# Patient Record
Sex: Female | Born: 1945 | Race: White | Hispanic: No | Smoking: Former smoker
Health system: Southern US, Community
[De-identification: ages and names within clinical notes are randomized; demographics above are authoritative.]

## PROBLEM LIST (undated history)

## (undated) DIAGNOSIS — F329 Major depressive disorder, single episode, unspecified: Secondary | ICD-10-CM

## (undated) DIAGNOSIS — E669 Obesity, unspecified: Secondary | ICD-10-CM

## (undated) DIAGNOSIS — R7303 Prediabetes: Secondary | ICD-10-CM

## (undated) DIAGNOSIS — E785 Hyperlipidemia, unspecified: Secondary | ICD-10-CM

## (undated) DIAGNOSIS — E559 Vitamin D deficiency, unspecified: Secondary | ICD-10-CM

## (undated) DIAGNOSIS — K219 Gastro-esophageal reflux disease without esophagitis: Secondary | ICD-10-CM

## (undated) DIAGNOSIS — F32A Depression, unspecified: Secondary | ICD-10-CM

## (undated) DIAGNOSIS — I10 Essential (primary) hypertension: Secondary | ICD-10-CM

## (undated) HISTORY — PX: ABDOMINAL HYSTERECTOMY: SHX81

## (undated) HISTORY — DX: Depression, unspecified: F32.A

## (undated) HISTORY — DX: Essential (primary) hypertension: I10

## (undated) HISTORY — DX: Prediabetes: R73.03

## (undated) HISTORY — DX: Obesity, unspecified: E66.9

## (undated) HISTORY — PX: CARPAL TUNNEL RELEASE: SHX101

## (undated) HISTORY — PX: LASIK: SHX215

## (undated) HISTORY — DX: Hyperlipidemia, unspecified: E78.5

## (undated) HISTORY — PX: SKIN CANCER EXCISION: SHX779

## (undated) HISTORY — PX: OTHER SURGICAL HISTORY: SHX169

## (undated) HISTORY — DX: Major depressive disorder, single episode, unspecified: F32.9

## (undated) HISTORY — DX: Vitamin D deficiency, unspecified: E55.9

## (undated) HISTORY — DX: Gastro-esophageal reflux disease without esophagitis: K21.9

## (undated) NOTE — Telephone Encounter (Signed)
 Formatting of this note might be different from the original. Spoke to pt who is overdue for an apt.  She would prefer to be seen in the [facility].  She is a former pt of Dr Malachy.  Apt scheduled with MD as ACP available are all EP providers.  Electronically signed by Devere Jenkins Starling, RN at 04/13/2022 10:45 AM EST

## (undated) NOTE — Telephone Encounter (Signed)
 Formatting of this note might be different from the original.  Medication Refill Request  Action taken:   deferred for nurse review, medication has been discontinued from chart.     Electronically signed by Marieta Kerin Oz, MA at 04/11/2022 10:06 AM EST

---

## 1976-04-09 HISTORY — PX: PILONIDAL CYST EXCISION: SHX744

## 2003-08-10 ENCOUNTER — Inpatient Hospital Stay (HOSPITAL_COMMUNITY): Admission: EM | Admit: 2003-08-10 | Discharge: 2003-08-12 | Payer: Self-pay

## 2003-08-10 ENCOUNTER — Encounter (INDEPENDENT_AMBULATORY_CARE_PROVIDER_SITE_OTHER): Payer: Self-pay | Admitting: Cardiology

## 2004-04-25 ENCOUNTER — Observation Stay (HOSPITAL_COMMUNITY): Admission: EM | Admit: 2004-04-25 | Discharge: 2004-04-27 | Payer: Self-pay

## 2005-02-12 ENCOUNTER — Inpatient Hospital Stay (HOSPITAL_COMMUNITY): Admission: EM | Admit: 2005-02-12 | Discharge: 2005-02-16 | Payer: Self-pay | Admitting: Emergency Medicine

## 2007-08-04 ENCOUNTER — Encounter: Admission: RE | Admit: 2007-08-04 | Discharge: 2007-08-04 | Payer: Self-pay | Admitting: Internal Medicine

## 2008-02-02 ENCOUNTER — Encounter: Admission: RE | Admit: 2008-02-02 | Discharge: 2008-02-02 | Payer: Self-pay | Admitting: Internal Medicine

## 2008-03-27 LAB — HM MAMMOGRAPHY: HM Mammogram: NEGATIVE

## 2010-08-25 NOTE — Procedures (Signed)
CLINICAL HISTORY:  The patient is a 65 year old with possible cerebral  vascular accident. Study is being done to look for evidence of conduction  abnormalities in the brain-stem auditory pathways.   PROCEDURE:  The study was carried out using a 100-microsecond square-wave  click generated at 85 decibels. Frequency was 11.1 seconds. A 10-millisecond  epoch was described. Two thousand stimuli were averaged to provide the final  response. All latencies and inter peak latencies were expressed in  milliseconds.   FINDINGS:  Stimulation of the left ear produced well-defined waveforms with  excellent interim correlation. Wave 1:  1.54, wave 3:  3.80, wave 5:  5.87.  Similarly, stimulation of the right ear produced well defined wave forms  with good inter run correlation. Latencies were as follows:  Wave 1:  1.70,  wave 3:  3.88, wave 5:  5.99.   Inter peak latencies on the right side between wave 1 and 3:  2.26, between  waves 3 and 5 2.07, between waves 1 and 5:  4.33. On the right between waves  1 and 3:  2.18, between waves 3 and 5:  2.11, between waves 1 and 5:  4.29.   IMPRESSION:  These brain-stem auditory evoked responses are within normal  limits and show no evidence of conduction abnormality in the brainstem  auditory pathways bilaterally.    WILLIAM H. Sharene Skeans, M.D.   ZOX:WRUE  D:  08/12/2003 00:20:21  T:  08/12/2003 07:51:51  Job #:  [id number]   cc:   Marlan Palau, M.D.  [address]  [address]  [place]  [place] [address]  Fax: [phone]

## 2010-08-25 NOTE — H&P (Signed)
[name], [name]                ACCOUNT NO.:  [id number]   MEDICAL RECORD NO.:  [id number]          PATIENT TYPE:  EMS   LOCATION:  ED                           FACILITY:  [facility]   PHYSICIAN:  Lonia Blood, M.D.       DATE OF BIRTH:  1946-02-13   DATE OF ADMISSION:  02/12/2005  DATE OF DISCHARGE:                                HISTORY & PHYSICAL   PRIMARY CARE PHYSICIAN:  Lucky Cowboy, M.D.   CHIEF COMPLAINT:  Back pain.   HISTORY OF PRESENT ILLNESS:  [name] is a 65 year old woman with a  history of hypertension who comes into [facility] emergency room after she  had four days of suprapubic tenderness, passing cloudy urine and hematuria.  She reports on the day of admission, her symptoms got significantly more  severe with the pain radiating to the back.  She has seen her doctor, who  prescribed ciprofloxacin for her.  The patient was unable to take the  medication due to nausea, and she was unable to take the antibiotic, so she  came to the emergency room to be evaluated.   PAST MEDICAL HISTORY:  1.  Transient ischemic attack.  2.  Gastroesophageal reflux disease.  3.  Hypertension.  4.  Basal cell carcinoma of the skin.   FAMILY HISTORY:  She has two children who are healthy.  Eight brothers, six  of them living.  Parents are both deceased.  No history of cancer in the  family.   SOCIAL HISTORY:  Patient does not drink alcohol.  She does not smoke  tobacco.  She is widowed.  She has two children.  She takes care of her  grandchild.  She is currently retired.   DRUG ALLERGIES:  PENICILLIN.   MEDICATIONS:  Potassium chloride, chloride, Zetia, Prozac, Xanax p.r.n.,  aspirin, hydrochlorothiazide, triamterene, Prilosec, and Foltx.   REVIEW OF SYSTEMS:  Negative for chest pain.  Negative for dyspnea.  Positive for some calf pain.  Negative for leg swelling and positive for  nausea.  All other systems are negative.   PHYSICAL EXAMINATION:  VITAL SIGNS:  Temperature  99.2, pulse 74,  respirations 22, blood pressure 135/81.  GENERAL APPEARANCE:  She is well-developed and well-nourished in no acute  distress.  Alert and oriented to person, place, and time.  HEENT:  Head is normocephalic and atraumatic.  Eyes:  Pupils are equal,  round and reactive to light and accommodation.  Extraocular movements are  intact.  Conjunctivae anicteric.  Throat is clear.  NECK:  Without JVD.  No carotid bruits.  No thyromegaly.  LUNGS:  Clear to auscultation without wheezes, rhonchi, or crackles.  HEART:  S1 and S2.  Regular rate and rhythm without murmurs, rubs or  gallops.  ABDOMEN:  Soft, nontender, nondistended.  Bowel sounds are present.  EXTREMITIES:  Without edema.  There is positive CVA tenderness bilaterally.  There is also positive tenderness over the lumbosacral spine in the midline.  There is positive sciatic points bilaterally.  The straight leg raise test  is negative.  SKIN:  Patient has some  small basal cell cancers on the dorsum of the hands.   LABORATORY VALUES:  White blood cell count 19,000, hemoglobin 14, platelet  count 189.  Sodium 132, potassium 3.8, glucose 155, BUN 9, creatinine 1.1,  chloride 98, bicarb 27.  Urinalysis shows 21-50 white blood cells.  The  blood smear shows vacuolated neutrophils with toxic granulations.   IMPRESSION:  1.  Pyelonephritis with significant systemic symptoms:  Patient unable to      keep the oral medication down.  The plan is to admit patient to the      hospital, obtain a urinary culture, start her on intravenous and      ciprofloxacin.  If her symptoms will not improve in the next couple of      days, we will proceed with imaging studies for her kidneys.  2.  Back pain, acute-on-chronic:  Will image the lumbosacral spine, as we      suspect this patient may have degenerative disk disease of the spine.      We will treat her empirically with Tylenol p.r.n. as well as oxycodone      as needed for more severe  pain.  3.  Esophageal reflux disease:  We will give this patient Protonix while in      the hospital.      Lonia Blood, M.D.  Electronically Signed     SL/MEDQ  D:  02/12/2005  T:  02/12/2005  Job:  [id number]   cc:   Lucky Cowboy, M.D.  Fax: [phone]

## 2010-08-25 NOTE — H&P (Signed)
[name], [name]                          ACCOUNT NO.:  [id number]   MEDICAL RECORD NO.:  [id number]                   PATIENT TYPE:  INP   LOCATION:  3727                                 FACILITY:  [facility]   PHYSICIAN:  Renato Battles, M.D.                  DATE OF BIRTH:  February 20, 1946   DATE OF ADMISSION:  08/09/2003  DATE OF DISCHARGE:                                HISTORY & PHYSICAL   REASON FOR ADMISSION:  Dizziness.   HISTORY OF PRESENT ILLNESS:  The patient is a very pleasant 65 year old  white female with vertigo, blurry vision, and presyncope symptoms that  started suddenly today and lasted for a few minutes and then resolved  spontaneously.  The patient reported having similar symptoms or episodes  periodically, the last episode was about one week ago.  Each episode was  associated with heaviness and numbness of the upper extremities bilaterally.  No loss of consciousness.  No other neurologic findings.   REVIEW OF SYSTEMS:  CONSTITUTIONAL:  No fevers, chills, or night sweats.  No  weight changes.  GASTROINTESTINAL:  No nausea, vomiting, diarrhea,  constipation.  CARDIOPULMONARY:  Positive for shortness of breath, but no  chest pain, no cough, no orthopnea or PND.  GENITOURINARY:  No dysuria,  hematuria, or retention.   PAST MEDICAL HISTORY:  1. Depression.  2. Pulmonary hypertension.  3. Hypercholesterolemia.   PAST SURGICAL HISTORY:  1. Bilateral LASIK surgery on her eyes.  2. Bilateral carpal tunnel surgery.  3. Vaginal hysterectomy.   FAMILY HISTORY:  Positive for CAD, stroke, and Alzheimer's.   SOCIAL HISTORY:  She quit smoking 8 years ago.  No alcohol or drugs.  Very  active.   ALLERGIES:  PENICILLIN.   MEDICATIONS:  1. Lipitor 40 mg p.o. daily.  2. Prozac 20 mg p.o. daily.  3. Xanax 1 mg p.o. q.h.s.  4. Lasix 20 mg p.o. daily.  5. Aspirin 81 mg p.o. daily.  6. Potassium supplement.  7. Long-acting diuretic that she could not recall the name of.   PHYSICAL EXAMINATION:  GENERAL:  She is alert and oriented x3, in no acute  distress.  VITAL SIGNS:  Temperature 98.1, heart rate 55, respiratory rate 20, blood  pressure 110/43.  HEENT:  The head is normocephalic, atraumatic.  Pupils equal, round,  reactive to light and accommodation.  NECK:  No lymphadenopathy, no adenopathy, no JVD.  CHEST:  Clear to auscultation bilaterally.  No wheezes, rhonchi, or rales.  HEART:  Regular rate and rhythm.  No murmurs.  ABDOMEN:  Soft, nontender, nondistended, normoactive bowel sounds.  EXTREMITIES:  No cyanosis, clubbing, or edema.  NEUROLOGIC:  Nonfocal.   LABORATORY DATA:  Electrolytes were within normal limits except for a low  potassium of 3.1.  Cardiac enzymes were negative.  EKG was normal.  The head  CT scan showed multiple lacunar infarctions, all undetermined age.   ASSESSMENT  AND PLAN:  1. Presyncope associated with dizziness.  This is most likely secondary to     silent stroke.  I am going to admit the patient for workup of stroke,     increase the dose of aspirin to 325 mg and start Plavix on top on it.     Check cholesterol levels.  Check carotid Dopplers and echocardiogram for     any source of strokes.  2. Swelling.  This is a little vague.  The patient relates that she has a     history of retaining fluid.  I am going to check echocardiogram for     possible congestive heart failure, especially with a history of shortness     of breath, and monitor daily I's and O's.  At this point, the patient     seems to be dehydrated with a low potassium and low blood pressure.  I am     going to hold the Lasix for now.  3. Depression.  Continue home medications.  4. Low potassium.  We are going to treat this with oral doses of potassium     supplement.   PRIMARY CARE PHYSICIAN:  Lucky Cowboy, M.D.                                                Renato Battles, M.D.    SA/MEDQ  D:  08/10/2003  T:  08/10/2003  Job:  [id number]

## 2010-08-25 NOTE — Cardiovascular Report (Signed)
[name], ROUNDS                ACCOUNT NO.:  [id number]   MEDICAL RECORD NO.:  [id number]          PATIENT TYPE:  INP   LOCATION:  6524                         FACILITY:  [facility]   PHYSICIAN:  Cristy Hilts. Jacinto Halim, MD       DATE OF BIRTH:  June 08, 1945   DATE OF PROCEDURE:  04/26/2004  DATE OF DISCHARGE:                              CARDIAC CATHETERIZATION   REFERRING PHYSICIAN:   PROCEDURE PERFORMED:  1.  Left ventriculography.  2.  Selective left coronary arteriography.  3.  Ascending aortogram.  4.  Abdominal aortogram.  5.  Right femoral angiography and closure of right femoral arterial access      with Angioseal.   INDICATIONS:  Ms. [name] is a 65 year old female with history of  hypertension, hyperlipidemia, elevated homocysteine levels, and history of  lacunar infarct in the past, who was admitted with chest pain suggestive of  angina pectoris.  Given her multiple cardiac risk factors, she was brought  to the cardiac catheterization lab for definitive diagnosis of coronary  disease.   HEMODYNAMIC DATA:  The left ventricular pressures were 113/6, with end-  diastolic pressure of 10 mmHg.  The aortic pressure was 118/64, with a mean  of 86 mmHg.  There was no pressure gradient across the aortic valve.   ANGIOGRAPHIC DATA:  Left ventricle:  Left ventricular systolic function was  normal, with ejection fraction estimated at 60%.  There was no significant  mitral regurgitation.   Right coronary artery:  Right coronary artery is a large vessel.  It is  dominant and large calibre vessel and gives rise to a large PDA.  It has  mild luminal irregularity.   Left main coronary artery.  Left main coronary artery is a large vessel.  Distal left main has 10-20 luminal obstruction, but no significant luminal  irregularity otherwise.   Left circumflex.  The left circumflex is a moderate to large caliber vessel.  It gives origin to a large obtuse marginal 1 and continues as an obtuse  marginal 2.  It is normal.   Ramus intermedius.  The ramus intermedius is a small vessel.  It is normal.   Left anterior descending artery.  The left anterior descending artery is a  moderate caliber vessel.  It ends at the apex.  It has mild luminal  irregularity.  It gives origin to small diagonal 1 and diagonal 2.   Ascending aortogram.  The ascending aortogram revealed presence of  three  aortic valve cusps.  There is no evidence of aortic dissection, no evidence  of aortic regurgitation.   Abdominal aortogram.  Abdominal aortogram reveals a two renal arteries one  on either sides and aorto iliac vessels were patent.  No evidence of  abdominal aortic aneurysm.   Right femoral angiography revealed good arterial access site.   IMPRESSION:  1.  Normal Left ventricular systolic function, ejection fraction 60%.  2.  Mild noncritical coronary artery disease, with 10-20% luminal      irregularity, mostly of the right coronary artery.  3.  Chest pain, probably of noncardiac etiology, including gastrointestinal  etiology could be considered, and gastrointestinal workup may be      indicated.   DISPOSITION:  The patient will be discharged on proton pump inhibitor, with  continued risk factor modification.   TECHNIQUE OF THE PROCEDURE:  Under usual sterile precautions, using 6 French  right femoral arterial access, 6 [place] multipurpose B-2 catheter was  advanced into the ascending aorta over a 0.035J wire  The catheter was then  advanced into the ascending left ventricle.  Left ventricular pressures were  monitored. The catheter was pulled back in the ascending aorta, and the  PGwas monitered across the AV. RCA was cannulated and angiography was  performed.  In a similar fashion, left  main coronary artery was engaged and  angiography was performed.  Then, the catheter was pulled back into the  ascending aorta, and ascending aortogram was performed in the LAO  projection.  The  same catheter was pulled back in the abdominal aorta, and  an abdominal aortogram was performed.  Then, the catheter was pulled out of  the body in the usual fashion.  Right femoral angiography was performed  through the arterial access sheath, and the access was closed with  Angioseal, with excellent hemostasis.   PLAN:  The patient was transferred to recovery in stable condition.  The  patient tolerated the procedure well.      Kirk Ruths  D:  04/26/2004  T:  04/26/2004  Job:  [id number]

## 2010-08-25 NOTE — Consult Note (Signed)
[name], [name]                          ACCOUNT NO.:  [id number]   MEDICAL RECORD NO.:  [id number]                   PATIENT TYPE:  INP   LOCATION:  3732                                 FACILITY:  [facility]   PHYSICIAN:  Marlan Palau, M.D.               DATE OF BIRTH:  02/09/1946   DATE OF CONSULTATION:  08/10/2003  DATE OF DISCHARGE:                                   CONSULTATION   HISTORY OF PRESENT ILLNESS:  [name] is a 65 year old right handed  white female born 05/31/1945 with a history of pulmonary hypertension,  obesity, small vessel disease by CT of the head. The patient has been  admitted for evaluation of episodic dizziness that she reports has been  going on and off for not quite a year. The patient has had a couple of  severe episodes recently. The patient claims that the onset of the problem  associated with the light-headed feeling. The patient has had at least one  episode of true vertigo without nausea, vomiting. The patient feels as if  she is going to blackout. May have some blurring as well as dimming of  vision. Notes increased heart rate, choking sensation, shortness of breath.  The patient with the most recent episode also had associated headache,  heaviness of the arms, pain across the shoulders, tingling sensations in the  shoulders, and down in the hands. The patient noted that she was weak all  over. The patient has not blacked out. The patient notes the dizziness may  last two to three minutes. The headache, when it does occur, may last two to  three hours. The patient has undergone an MRI scan of the brain that shows  chronic small vessel ischemic changes, no evidence of significant brain stem  ischemia. MR angiogram is unremarkable from the arch throughout the  intracranial vasculature. No evidence of vertebral basilar insufficiency is  seen. No evidence of a subclavian steel is noted. The patient has  essentially normalized at this  point and neurology is asked to see her for  further evaluation.   History is notable that the patient does not have headaches usually. No  prior history of migraine.   PAST MEDICAL HISTORY:  Notable for:  1. Episodes of dizziness as above.  2. Small vessel disease by MRI and CT.  3. Bilateral carpal tunnel syndrome.  4. Obesity.  5. Hypercholesterolemia.  6. Pilonidal cyst resection.  7. Status post hysterectomy.  8. Lasic surgery on the eyes.  9. Depression.  10.      Pulmonary hypertension.   SOCIAL HISTORY:  The patient does not smoke. Drinks alcohol on occasion. The  patient lives in the [place] area. Is widowed. Has two daughters; one has  obesity, one has renal calculi.   ALLERGIES:  Penicillin.   CURRENT MEDICATIONS:  1. Xanax 1 mg p.o. q.h.s.  2. Aspirin 325  mg a day.  3. Lipitor 40 mg daily.  4. Plavix 75 mg a day.  5. Prozac 40 mg daily.  6. Protonix 40 mg daily.  7. Potassium supplementation 40 mEq daily.  8. The patient was on Lasix at home and another diuretic, though she could     not remember the name of it.   FAMILY HISTORY:  Mother died with heart disease. Father died with stroke.  The patient has eight brothers, no sisters. One brother died at age 27 with  an MI. Three brothers have had strokes and are still living.   REVIEW OF SYSTEMS:  Notable for no recent fevers or chills. The patient  notes no neck stiffness. Denies any abdominal pain. Denies nausea and  vomiting. Denies trouble controlling the bowels or bladder. Denies any  significant gait disturbance.   PHYSICAL EXAMINATION:  VITAL SIGNS:  Blood pressure 102/58, heart rate 77,  respiratory rate 18, temperature afebrile.  GENERAL:  The patient is a moderately obese white female who is alert,  cooperative at the time of examination.  HEENT:  Head is atraumatic. Eyes:  Pupils round and reactive to light. Disks  are flat bilaterally.  NECK:  Supple. No carotid bruit noted.  RESPIRATORY:   Clear.  CARDIOVASCULAR:  Regular rate and rhythm. No obvious murmur or rubs noted.  EXTREMITIES:  Without significant edema.  NEUROLOGIC:  Cranial nerves:  Facial symmetry is present. The patient has  good sensation in the face to pinprick and soft touch bilaterally. Has good  strength to facial muscles and the muscles for head turn and shoulder shrug  bilaterally. Speech is well-enunciated, not aphasic. Motor testing was 5/5  strength in all fours. Good symmetric motor tone is noted throughout.  Sensory testing is intact to pinprick and soft touch __________ sensation  throughout. The patient has good finger-to-nose-to-finger and toe-to-finger  bilaterally. The patient is not ambulated. Deep tendon reflexes are  symmetrical and normal. Toes downgoing bilaterally. No drift is seen.   LABORATORY DATA:  White count 6.9, hemoglobin 13.0, hematocrit 38.0,  platelets of 177. MCV again is 88. Sodium 137, potassium 3.1, chloride 102,  CO2 27, glucose 115, BUN 14, creatinine 0.9. Total bili 0.7. Alk phosphatase  is 67. SGOT 33, SGPT 44. Total protein 6.1, albumin 3.4. Calcium 8.9. CK  104. Troponin I 0.01.   MRI scan of the head is as above.   IMPRESSION:  1. Episodic dizziness, feelings of near syncope, headache, neck, and     shoulder pain. The etiology unclear.  2. Obesity.  3. Pulmonary hypertension.   PLAN:  This patient reports episodes of dizziness that generally are  associated with various symptoms such as headaches on occasion, shoulder  pain on occasion. The patient claims that most of the dizziness episodes  occur with associated increased heart rate, shortness of breath, choking  sensations. The patient has also noted some tingling sensations in the hands  at times. Due need to rule out the possibility of cardiac rhythm  abnormalities, although this patient is on a heart monitor at this time. Need to consider the possibility of an atypical cardiac angina, rule out  migraine,  or panic disorder as the cause for her current symptomatology. No  evidence of acute cerebrovascular infarction is noted. No evidence of  vertebral basilar insufficiency is noted. Will check orthostatic blood  pressure.  Keep the patient on antiplatelet agents. Check a brainstem  auditory evoked response test. We will follow the patient while in  house.   Carotid Doppler study has been done and it is unremarkable. A 2-D  echocardiogram has been completed, the results are notable for an ejection  fraction of 55-65%, the left ventricular wall thickness is mildly increased.  No evidence of embolic sources noted.                                               Marlan Palau, M.D.    CKW/MEDQ  D:  08/10/2003  T:  08/11/2003  Job:  [id number]   cc:   Renato Battles, M.D.  Fax: [phone]

## 2010-08-25 NOTE — Discharge Summary (Signed)
[name], [name]                          ACCOUNT NO.:  [id number]   MEDICAL RECORD NO.:  [id number]                   PATIENT TYPE:  INP   LOCATION:  3732                                 FACILITY:  [facility]   PHYSICIAN:  Elliot Cousin, M.D.                 DATE OF BIRTH:  Feb 15, 1946   DATE OF ADMISSION:  08/09/2003  DATE OF DISCHARGE:  08/12/2003                           DISCHARGE SUMMARY - REFERRING   DISCHARGE DIAGNOSES:  1. Disequilibrium/presyncope with dizziness.  2. 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  3. Multilevel cervical spondylotic changes and moderate spinal stenosis at     C5 to C6.  4. Hyperlipidemia.  5. Elevated homocystine level.  6. Chronic peripheral edema.   SECONDARY DISCHARGE DIAGNOSES:  1. History of pulmonary hypertension.  2. Depression.  3. Bilateral LASIK surgery on both eyes bilaterally.  4. History of bilateral carpal tunnel syndrome.  5. Status post vaginal hysterectomy in the past.   DISCHARGE MEDICATIONS:  1. Aspirin 81 mg to 325 mg daily.  2. Foltx one pill daily.  3. Lipitor 40 mg daily.  4. Prozac 20 mg daily.  5. Xanax 1 mg q.h.s.  6. Lasix 20 mg daily.  7. Potassium supplement one daily.  8. Longacting diuretic as previously prescribed one daily.   DISPOSITION:  The patient was discharged to home in improved and stable  condition on Aug 12, 2003.  She was advised to follow up with Dr. Oneta Rack in  five to seven days.   CONSULTATION:  Marlan Palau, M.D.   PROCEDURES PERFORMED:  1. CT scan of the head without contrast on Aug 09, 2003.  The results     revealed accelerated atrophy.  Lacunar infarcts of the left internal and     external capsule, age indeterminate.  Negative for hemorrhage.  No acute     intracranial hemorrhage or edema.  2. MRI of the brain/MRA of the brain and neck on Aug 10, 2003.  The results     revealed mild atrophy.  Moderate small vessel  disease.  Negative for     acute infarction.  No abnormal intracranial enhancement.  No significant     proximal stenosis in the intracranial circulation.  Mild intracranial     atherosclerotic disease.  MRA of the extracranial circulation revealed a     negative study.  No stenosis.  3. Carotid Dopplers on Aug 10, 2003.  The results revealed no evidence of     significant ICA stenosis.  Vertebral artery flow is antegrade.  4. A 2-D echocardiogram  on Aug 10, 2003.  The results revealed overall left     ventricular systolic function and size were normal.  Ejection fraction     ranging between 55 and 65%.  No diagnostic  left ventricular regional     wall  motion abnormalities.  There was mild valvular regurgitation.  5. BAER study.  The results pending at the time of hospital discharge.  6. MRI of the cervical spine on Aug 11, 2003.  The results revealed     multilevel cervical spondylotic changes most notable at the C5 to C6     level.  Although this causes moderate spinal stenosis and slight cord     deformity at this level, the cord does not appear to be significantly     compressed and there is no gliosis or edema within the cord.   HISTORY OF PRESENT ILLNESS:  The patient is a 65 year old lady with a past  medical history significant for hyperlipidemia, depression, and pulmonary  hypertension who presented to the emergency department on Aug 09, 2003, with  an episode of dizziness and the feeling as if she was going to pass out.  The symptoms started suddenly and lasted for a few minutes and then resolved  spontaneously.  The patient had at least one episode of vertigo without  nausea and vomiting.  She may have had some blurring as well as some dimming  of her vision.  She felt weak all over.  She did not black out.  She had  some intermittent heaviness of both of  her arms and across her shoulders  and a slight headache.  She denied any chest pain.   HOSPITAL COURSE:  EPISODIC  DISEQUILIBRIUM/PRESYNCOPE WITH DIZZINESS:  The  initial evaluation started in the emergency department when the patient was  evaluated with a CT scan of the head without contrast.  The CT scan of the  head revealed atrophy and lacunar infarcts and the left internal capsule and  the external capsule which was read by the radiologist as age indeterminate.  There was no evidence of acute hemorrhage or edema.  The patient had been  taking an 81 mg aspirin daily.  Plavix at 75 mg daily was added to the  regimen during the hospital course.  For further evaluation, an MRI and MRA  of the brain and neck were ordered.  The patient was placed on telemetry.  Carotid Dopplers were ordered as well as a 2-D echocardiogram  as well as a  homocystine level as well as assessment of the patient's fasting lipid  panel.  Orthostatic vital signs were also ordered each morning.  Following  these assessments, neurologist, Dr. Anne Hahn, was consulted.  The MRI of the  brain revealed no evidence of acute strokes and no evidence of old strokes.  The MRI of the brain did reveal moderate small vessel disease and mild  atrophy.  The MRA of the intracranial circulation revealed no significant  proximal stenosis, however, there was evidence of mild intracranial  atherosclerotic disease.  The MRA of the neck was completely within normal  limits, no evidence of stenosis or atherosclerotic changes.  The carotid  Doppler study was negative for ICA stenosis.  The vertebral artery flow was  antegrade.  The patient's EKG revealed a normal sinus rhythm with no  abnormalities.  Cardiac enzymes were ordered x3 sets and were completely  within normal limits.  The 2-D echocardiogram  as read by Dr. Lucas Mallow,  revealed an ejection fraction of 55 to 65%.  The left ventricular size as  well as the left ventricular systolic function was completely within normal limits.  There was no evidence of regional wall motion abnormalities.  The   patient's fasting lipid panel revealed a total cholesterol of  145,  triglycerides 164, HDL cholesterol of 37, and LDL of 75. The patient was  maintained on Lipitor 40 mg daily.  The homocystine level was elevated at  17.62 (5 to 13.9 within normal limits).  The patient was therefore started  on Foltx one tablet daily.  The patient had some mild orthostatic changes  with her heart rate, however, they were not diagnostic for orthostatic  changes in her blood pressure.  Her potassium was low at 3.1 and she was  repleted with potassium during the hospital course.   Dr. Anne Hahn assessed the patient on hospital day #2.  He agreed with the  studies ordered for evaluation.  He recommended evaluation of the patient's  cervical spine given her history of chronic neck and shoulder pain.  He also  recommended evaluating the patient with a brain stem auditory evoked  response test.  The MRI of the cervical spine revealed that the patient had  moderate cervical spondylotic changes most notable at C5 to C6.  There was  also evidence of moderate spinal stenosis and slight cord deformity at this  level, although the cord did not appear to be significantly compressed and  there was no evidence of edema within the cord.  The patient also mentioned  that she does have intermittent tingling in her fingers and toes at times.  Per Dr. Anne Hahn' assessment, surgical intervention was probably not needed at  this time.   Physical therapy was consulted for evaluation.  The patient apparently had  no physical therapy needs with regards to dizziness and disequilibrium.  The  patient demonstrated no dizziness or presyncopal episodes during the  hospital course.  She ambulated in the hallway and in the room each day  without any symptoms.  The etiology of the patient's episodic dizziness and  presyncopal symptoms is unknown.  She was advised to continue the baby  aspirin daily.  The Plavix was discontinued given the  findings of the MRA  and MRI.  The patient will probably need a neurosurgical consultation  electively/in the outpatient setting.  This decision will be deferred to the  patient's primary care physician, Dr. Oneta Rack.                                                Elliot Cousin, M.D.    DF/MEDQ  D:  08/13/2003  T:  08/13/2003  Job:  [id number]   cc:   Lucky Cowboy, M.D.  [address]  [place], [place] [address]  Fax: [phone]   C. Lesia Sago, M.D.  [address]  [address]  [place]  [place] [address]  Fax: [phone]

## 2010-08-25 NOTE — Discharge Summary (Signed)
NAMESOLENNE, MANWARREN                ACCOUNT NO.:  [id number]   MEDICAL RECORD NO.:  [id number]          PATIENT TYPE:  INP   LOCATION:  6523                         FACILITY:  [facility]   PHYSICIAN:  Vonna Kotyk R. Jacinto Halim, MD       DATE OF BIRTH:  Oct 05, 1945   DATE OF ADMISSION:  04/25/2004  DATE OF DISCHARGE:  04/27/2004                                 DISCHARGE SUMMARY   DISCHARGE DIAGNOSES:  1.  Chest pain, worrisome for unstable angina, no significant coronary      disease at catheterization this admission with normal left ventricular      function.  2.  Hypokalemia, probably secondary to diuretic use as an outpatient.  3.  Dyslipidemia, the patient was on Zetia and Lipitor 40 prior to      admission, she did have slightly elevated SGOT and SGPT, and we cut her      Zocor back after her catheterization.  4.  Tachypalpitations and presyncope.  5.  Possible sleep apnea.   HOSPITAL COURSE:  The patient is a 65 year old female followed by Dr.  Milinda Cave with history of hypertension, elevated homocysteine level, and  dyslipidemia.  She was admitted on April 25, 2004, with chest pain.  Please see Dr. Rosaura Carpenter admission history and physical for complete  details.  The patient was put on heparin and ruled out for an myocardial  infarction.  Nitrates were added.  She underwent diagnostic catheterization  on April 26, 2004, which showed normal coronaries and normal LV function.  The patient was kept overnight.  We feel she can be discharged on April 27, 2004.  She does have complaints of tachypalpitations and presyncope.  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  We will also get a sleep  study.  She admitted at discharge that she had been taking Zaroxolyn as well  as Lasix as an outpatient for intermittent edema.  Her potassium was low on  admission.  We stopped all her diuretics.  We will also cut her Zocor back  to 20 mg a day.   DISCHARGE MEDICATIONS:  1.  Prozac 10 mg a day.  2.   Aspirin 81 mg a day.  3.  Zetia 10 mg a day.  4.  Zocor 20 mg a day.  5.  Foltx once a day.   LABORATORY STUDIES AT DISCHARGE:  Sodium 139, potassium 3.5, BUN 11,  creatinine 0.8.  LDL was 102, HDL 39, total cholesterol 161, triglycerides  192.  White count 5.3, hemoglobin 12.8, hematocrit 37.6, platelets 202,000.  Hemoglobin A1C is 6.1.  EKG reveals sinus rhythm without acute changes.  Chest x-ray shows mild cardiac enlargement with no CHF or pneumonia.   DISPOSITION:  The patient is discharged in stable condition and will follow  up with Dr. Jacinto Halim as an outpatient.  She will need follow-up liver function  tests in about four weeks.      Antonieta Loveless  D:  04/27/2004  T:  04/27/2004  Job:  [id number]   cc:   Jeoffrey Massed, MD  [phone]  [address]  [place]  [place] [address]  Fax: [phone]

## 2010-08-25 NOTE — H&P (Signed)
NAMEMOZETTA, MURFIN                ACCOUNT NO.:  [id number]   MEDICAL RECORD NO.:  [id number]          PATIENT TYPE:  EMS   LOCATION:  MAJO                         FACILITY:  [facility]   PHYSICIAN:  Ulyses Amor, MD DATE OF BIRTH:  08-25-1945   DATE OF ADMISSION:  04/25/2004  DATE OF DISCHARGE:                                HISTORY & PHYSICAL   [name] is a 65 year old white woman who is admitted to [facility] for further evaluation of chest pain.   The patient, who has no past history of cardiac disease, presented to the  emergency department after experiencing an episode of chest pain which began  at home earlier in the afternoon.  It occurred while she was walking out to  her mailbox.  Her chest pain was described as a pressure in the left  anterior chest.  It radiated to both upper arms and to her neck.  It was  associated with diaphoresis and dizziness but no dyspnea or nausea.  The  acute pain lasted for approximately 15 minutes.  It lingered as a lower  grade for an additional three hours until she presented to the emergency  department where nitroglycerin completely relieved her chest pain.  When she  experienced the chest pain, while walking out to the mailbox, she turned  around and returned home to rest.  There were no other exacerbating or  ameliorating factors.  The chest pain appeared not to be related to  position, meals, or respirations.  As noted, her chest pain is largely  resolved at this time.  The patient has no past history of cardiac disease,  including no history of chest pain, myocardial infarction, coronary artery  disease, congestive heart failure, or arrhythmia.  The patient was  hospitalized here approximately eight months ago for dysequilibrium and  presyncope.  This was felt to possibly be due to a lacunar infarct, though  their age was indeterminate by CT scan.   The patient has a number of other medical problems including:  1.   Hypertension.  2.  Dyslipidemia.  3.  Elevated homocystine level.  4.  She also has a history of pulmonary hypertension.  5.  Chronic peripheral edema.  6.  Depression.  7.  Cervical spine stenosis.   MEDICATIONS:  Foltx, Lipitor, Prozac, Furosemide, potassium, and Zetia.   MEDICATION ALLERGIES:  PENICILLIN.   OPERATIONS:  1.  Lasik surgery on both eyes.  2.  Vaginal hysterectomy.  3.  Removal of a pilonidal cyst.   SOCIAL HISTORY:  The patient is a widow.  She lives alone.  She does not  work.  She neither smokes nor drinks.   FAMILY HISTORY:  Notable for coronary artery disease, stroke, and Alzheimer  dementia.   REVIEW OF SYSTEMS:  Reveals no new problems related to her head, eyes, ears,  nose, mouth, throat, lungs, gastrointestinal system, genitourinary system,  or extremities.  There is no history of neurologic or psychiatric disorder.  There is no history of fever, chills, or weight loss.   PHYSICAL EXAMINATION:  VITAL SIGNS:  Blood  pressure 109/73, pulse 73 and  regular, respirations 22, temperature 98.0.  GENERAL:  The patient was an obese, middle-aged white woman in no distress.  She was alert, oriented, appropriate, and responsive.  HEENT:  Normal.  NECK:  Without thyromegaly or adenopathy.  Carotid pulses were palpable  bilaterally and without bruits.  CARDIAC:  Revealed a normal S1 and S2.  There was no S3, S4, murmur, rub, or  click.  Cardiac rhythm was regular.  No chest wall tenderness was noted.  LUNGS:  Clear.  ABDOMEN:  Soft and nontender.  There was no mass, hepatosplenomegaly, bruit,  distention, rebound, guarding, or rigidity.  Bowel sounds were normal.  BREAST/PELVIC/RECTAL:  Were not performed as they were not pertinent to the  reason for acute care hospitalization.  EXTREMITIES:  Without edema, deviation, or deformity.  Radial and dorsalis  pedal pulses were palpable bilaterally.  NEUROLOGIC:  Brief screening neurologic survey was unremarkable.    The electrocardiogram was normal.  The chest radiography, according to the  radiologist, demonstrated mild diffuse peribronchial thickening but was  otherwise unremarkable.  The potassium was 3.8, BUN 13, and creatinine 0.9.  The initial set of cardiac markers revealed a CK-MB of 1.5, myoglobin 107,  and troponin less than 0.05.  The second set revealed a CK-MB of 2.3,  myoglobin of 144, and troponin less than 0.05.  The third set revealed a CK-  MB of 1.7, myoglobin 110, and troponin less than 0.05.  The remaining  studies were pending at the time of this dictation.   IMPRESSION:  1.  Chest pain, rule out angina.  The history is suggestive of cardiac      ischemia.  2.  Hypertension.  3.  Dyslipidemia.  4.  Elevated homocystine level.  5.  Status post lacunar infarct.  6.  Chronic peripheral edema.  7.  History of pulmonary hypertension.  8.  Depression.  9.  Cervical spinal stenosis.   PLAN:  1.  Telemetry.  2.  Serial cardiac enzymes.  3.  Aspirin.  4.  Intravenous heparin.  5.  Intravenous nitroglycerin.  6.  Further measures per Dr. Jacinto Halim.   This patient encounter was supervised by nurse C. Lenik.      Mitc   MSC/MEDQ  D:  04/25/2004  T:  04/26/2004  Job:  [id number]   cc:   Ulyses Amor, MD  [address]  [place], [place] [address]  Fax: [phone]   Cristy Hilts. Jacinto Halim, MD  [address]  [place]  [place] [address]  Fax: [phone]

## 2010-08-25 NOTE — Discharge Summary (Signed)
[name], [name]                ACCOUNT NO.:  [id number]   MEDICAL RECORD NO.:  [id number]          PATIENT TYPE:  INP   LOCATION:  1431                         [facility]AC[facility]   PHYSICIAN:  Nelma Rothman, MD   DATE OF BIRTH:  1946/02/22   DATE OF ADMISSION:  02/12/2005  DATE OF DISCHARGE:                                 DISCHARGE SUMMARY   PRIMARY CARE PHYSICIAN:  Lucky Cowboy, M.D.   DISCHARGE DIAGNOSES:  1.  Acute pyelonephritis.  2.  Back pain, secondary to degenerative disk disease.   SECONDARY DISCHARGE DIAGNOSES:  1.  History of hypertension.  2.  Basal cell carcinoma of the skin.  3.  Gastroesophageal reflux disease.   PROCEDURE:  1.  Portable chest x-ray on admission demonstrated low lung volumes with      minimal bibasilar segmental atelectasis.  2.  Plain films of the lumbar spine demonstrated no acute abnormality but      degenerative disk disease, particularly of L1-2.   LABORATORY DATA:  Urine culture was negative, however, this was sent after  the patient was already on antibiotics. White blood cell count on admission  was 19.2 which decreased to 5.5 the morning of discharge. Hemoglobin 12.2,  hematocrit 36.8 and platelets 187. On the morning of discharge, potassium  3.6, creatinine was 1.0.   HOSPITAL COURSE:  [name] is a very pleasant 65 year old female who  presented to the [facility] emergency department with worsening back pain.  She did note some cloudy urine as well as some suprapubic tenderness as  well. In fact, she had gone to her regular doctor and received a  prescription for Cipro which she had started the morning before presenting  to the emergency department. She had costovertebral tenderness on exam, and  urinalysis was positive for a large amount of leukocyte esterase as well as  white blood cells. This was felt to be consistent with acute pyelonephritis  and she was started on IV ciprofloxacin. With that, her peripheral white  blood cell count decreased from 19 on admission to 5.5 on the morning of  discharge. She had some low grade fevers that resolved prior to discharge.  She was switched to oral ciprofloxacin the morning prior to discharge and  remained afebrile and was felt appropriate to be discharged home on the  morning of November 10. She is afebrile and doing well. She will be  discharged to complete a total 10 day course of ciprofloxacin.   She does have a history of hypertension for which she takes  triamterene/hydrochlorothiazide. However, on admission, her sodium was  slightly low and she occasionally required potassium repletion. For that  reason, the diuretic was held. Her blood pressure remained less than 120  systolic throughout her entire hospitalization and I have therefore  recommended that she hold triamterene/hydrochlorothiazide until further  discussion with her regular doctor as I am not sure that she actually needs  it for blood pressure control.   DISCHARGE MEDICATIONS:  1.  Ciprofloxacin 500 mg p.o. b.i.d. x6 more days.  2.  Aspirin 81 mg p.o. daily.  3.  Zetia 10 mg p.o. daily.  4.  Prozac 20 mg p.o. daily.  5.  Centrum Silver 1 tablet p.o. daily.  6.  Prilosec 20 mg p.o. daily.  7.  Xanax 1 mg p.o. p.r.n. anxiety.   DISCHARGE INSTRUCTIONS:  The patient will followup with Dr. Oneta Rack as  previously scheduled. She does have some degenerative disk disease of her  lumbar spine and was instructed not to lift more than 10 pounds at a time  and limit activities as symptoms dictate.           ______________________________  Nelma Rothman, MD     RAR/MEDQ  D:  02/16/2005  T:  02/17/2005  Job:  [id number]   cc:   Lucky Cowboy, M.D.  Fax: [phone]

## 2010-09-25 ENCOUNTER — Other Ambulatory Visit: Payer: Self-pay | Admitting: Internal Medicine

## 2010-09-25 DIAGNOSIS — Z1231 Encounter for screening mammogram for malignant neoplasm of breast: Secondary | ICD-10-CM

## 2010-10-17 ENCOUNTER — Ambulatory Visit: Payer: Self-pay

## 2010-11-27 ENCOUNTER — Other Ambulatory Visit (HOSPITAL_COMMUNITY): Payer: Self-pay | Admitting: Internal Medicine

## 2010-11-27 ENCOUNTER — Ambulatory Visit (HOSPITAL_COMMUNITY)
Admission: RE | Admit: 2010-11-27 | Discharge: 2010-11-27 | Disposition: A | Discharge status: Home / Self Care | Payer: Medicare Other | Source: Ambulatory Visit | Attending: Internal Medicine | Admitting: Internal Medicine

## 2010-11-27 DIAGNOSIS — R0602 Shortness of breath: Secondary | ICD-10-CM

## 2010-11-27 DIAGNOSIS — R05 Cough: Secondary | ICD-10-CM

## 2010-11-27 DIAGNOSIS — R059 Cough, unspecified: Secondary | ICD-10-CM

## 2013-03-23 ENCOUNTER — Other Ambulatory Visit: Payer: Self-pay | Admitting: Internal Medicine

## 2013-03-23 ENCOUNTER — Other Ambulatory Visit: Payer: Self-pay | Admitting: Physician Assistant

## 2013-03-23 MED ORDER — FLUOXETINE HCL 20 MG PO CAPS: 20.0000 mg | ORAL_CAPSULE | Freq: Every day | ORAL | Status: DC

## 2013-03-27 ENCOUNTER — Encounter: Payer: Self-pay | Admitting: Physician Assistant

## 2013-03-30 ENCOUNTER — Ambulatory Visit (INDEPENDENT_AMBULATORY_CARE_PROVIDER_SITE_OTHER): Payer: Medicare Other | Admitting: Physician Assistant

## 2013-03-30 ENCOUNTER — Encounter: Payer: Self-pay | Admitting: Physician Assistant

## 2013-03-30 VITALS — BP 122/72 | HR 80 | Temp 98.1°F | Resp 16 | Ht 63.0 in | Wt 212.0 lb

## 2013-03-30 DIAGNOSIS — E669 Obesity, unspecified: Secondary | ICD-10-CM

## 2013-03-30 DIAGNOSIS — I10 Essential (primary) hypertension: Secondary | ICD-10-CM | POA: Insufficient documentation

## 2013-03-30 DIAGNOSIS — E785 Hyperlipidemia, unspecified: Secondary | ICD-10-CM | POA: Insufficient documentation

## 2013-03-30 DIAGNOSIS — R7309 Other abnormal glucose: Secondary | ICD-10-CM

## 2013-03-30 DIAGNOSIS — R7303 Prediabetes: Secondary | ICD-10-CM

## 2013-03-30 DIAGNOSIS — E559 Vitamin D deficiency, unspecified: Secondary | ICD-10-CM

## 2013-03-30 LAB — HEPATIC FUNCTION PANEL
ALT: 46 U/L — ABNORMAL HIGH (ref 0–35)
AST: 27 U/L (ref 0–37)
Albumin: 4.3 g/dL (ref 3.5–5.2)
Alkaline Phosphatase: 82 U/L (ref 39–117)
Bilirubin, Direct: 0.1 mg/dL (ref 0.0–0.3)
Indirect Bilirubin: 0.3 mg/dL (ref 0.0–0.9)
Total Bilirubin: 0.4 mg/dL (ref 0.3–1.2)
Total Protein: 6.9 g/dL (ref 6.0–8.3)

## 2013-03-30 LAB — CBC WITH DIFFERENTIAL/PLATELET
Basophils Absolute: 0 10*3/uL (ref 0.0–0.1)
Basophils Relative: 1 % (ref 0–1)
Eosinophils Absolute: 0.1 10*3/uL (ref 0.0–0.7)
Eosinophils Relative: 1 % (ref 0–5)
Hemoglobin: 14 g/dL (ref 12.0–15.0)
Lymphocytes Relative: 47 % — ABNORMAL HIGH (ref 12–46)
Lymphs Abs: 3.8 10*3/uL (ref 0.7–4.0)
MCH: 30.4 pg (ref 26.0–34.0)
MCV: 90.2 fL (ref 78.0–100.0)
Monocytes Absolute: 0.6 10*3/uL (ref 0.1–1.0)
Monocytes Relative: 7 % (ref 3–12)
Neutro Abs: 3.5 10*3/uL (ref 1.7–7.7)
Neutrophils Relative %: 44 % (ref 43–77)
Platelets: 236 10*3/uL (ref 150–400)
RBC: 4.61 MIL/uL (ref 3.87–5.11)
RDW: 13.8 % (ref 11.5–15.5)
WBC: 7.9 10*3/uL (ref 4.0–10.5)

## 2013-03-30 LAB — LIPID PANEL
HDL: 43 mg/dL (ref >39)
LDL Cholesterol: 84 mg/dL (ref 0–99)
Total CHOL/HDL Ratio: 3.9 Ratio
Triglycerides: 193 mg/dL — ABNORMAL HIGH (ref <150)
VLDL: 39 mg/dL (ref 0–40)

## 2013-03-30 LAB — TSH: TSH: 2.684 u[IU]/mL (ref 0.350–4.500)

## 2013-03-30 LAB — BASIC METABOLIC PANEL WITH GFR
BUN: 16 mg/dL (ref 6–23)
CO2: 25 mEq/L (ref 19–32)
Calcium: 9.6 mg/dL (ref 8.4–10.5)
Chloride: 103 mEq/L (ref 96–112)
Creat: 0.94 mg/dL (ref 0.50–1.10)
GFR, Est African American: 72 mL/min
GFR, Est Non African American: 62 mL/min
Glucose, Bld: 92 mg/dL (ref 70–99)
Potassium: 4.7 mEq/L (ref 3.5–5.3)
Sodium: 139 mEq/L (ref 135–145)

## 2013-03-30 LAB — HEMOGLOBIN A1C: Hgb A1c MFr Bld: 6.3 % — ABNORMAL HIGH (ref <5.7)

## 2013-03-30 MED ORDER — PREDNISONE 20 MG PO TABS: ORAL_TABLET | ORAL | Status: DC

## 2013-03-30 MED ORDER — PHENTERMINE HCL 37.5 MG PO TABS: 37.5000 mg | ORAL_TABLET | Freq: Every day | ORAL | Status: DC

## 2013-03-30 MED ORDER — ALPRAZOLAM 1 MG PO TABS: 1.0000 mg | ORAL_TABLET | Freq: Two times a day (BID) | ORAL | Status: DC | PRN

## 2013-03-30 NOTE — Patient Instructions (Addendum)
Bad carbs also include fruit juice, alcohol, and sweet tea. These are empty calories that do not signal to your brain that you are full.   Please remember the good carbs are still carbs which convert into sugar. So please measure them out no more than 1/2-1 cup of rice, oatmeal, pasta, and beans.  Veggies are however free foods! Pile them on.   I like lean protein at every meal such as chicken, [place], pork chops, cottage cheese, etc. Just do not fry these meats and please center your meal around vegetable, the meats should be a side dish.   No all fruit is created equal. Please see the list below, the fruit at the bottom is higher in sugars than the fruit at the top   Remember exercise is great for your cardiovascular health and can help with weight loss but YOU CAN NOT OUT RUN YOUR FORK!    The majority of colds are caused by viruses and do not require antibiotics. Please read the rest of this hand out to learn more about the common cold and what you can do to help yourself as well as help prevent the over use of antibiotics.   COMMON COLD SIGNS AND SYMPTOMS - The common cold usually causes nasal congestion, runny nose, and sneezing. A sore throat may be present on the first day but usually resolves quickly. If a cough occurs, it generally develops on about the fourth or fifth day of symptoms, typically when congestion and runny nose are resolving  COMMON COLD COMPLICATIONS - In most cases, colds do not cause serious illness or complications. Most colds last for three to seven days, although many people continue to have symptoms (coughing, sneezing, congestion) for up to two weeks.  One of the more common complications is sinusitis, which is usually caused by viruses and rarely (about 2 percent of the time) by bacteria. Having thick or yellow to green-colored nasal discharge does not mean that bacterial sinusitis has developed; discolored nasal discharge is a normal phase of the common cold.    Lower respiratory infections, such as pneumonia or bronchitis, may develop following a cold.  Infection of the middle ear, or otitis media, can accompany or follow a cold.  COMMON COLD TREATMENT - There is no specific treatment for the viruses that cause the common cold. Most treatments are aimed at relieving some of the symptoms of the cold, but do not shorten or cure the cold. Antibiotics are not useful for treating the common cold; antibiotics are only used to treat illnesses caused by bacteria, not viruses. Unnecessary use of antibiotics for the treatment of the common cold can cause allergic reactions, diarrhea, or other gastrointestinal symptoms in some patients.  The symptoms of a cold will resolve over time, even without any treatment. People with underlying medical conditions and those who use other over-the-counter or prescription medications should speak with their healthcare provider or pharmacist to ensure that it is safe to use these treatments. The following are treatments that may reduce the symptoms caused by the common cold.  Nasal congestion - Decongestants are good for nasal congestion- if you feel very stuffy but no mucus is coming out, this is the medication that will help you the most.  Pseudoephedrine is a decongestant that can improve nasal congestion. Although a prescription is not required, drugstores in the [place] keep pseudoephedrine behind the counter, so it must be requested from a pharmacist. If you have a heart condition or high blood  pressure please use Coricidin BPH instead.   Runny nose - Antihistamines such as diphenhydramine (Benadryl), certazine (Zyrtec) which are best taking at night because they can make you tired OR loratadine (Claritin),  fexafinadine (Allegra) help with a runny nose.   Nasal sprays such an oxymetazoline (Afrin and others) may also give temporary relief of nasal congestion. However, these sprays should never be used for more than two to  three days; use for more than three days use can worsen congestion.  Nasocort is now over the counter and can help decrease a runny nose. Please stop the medication if you have blurry vision or nose bleeds.     Sore throat and headache - Sore throat and headache are best treated with a mild pain reliever such as acetaminophen (Tylenol) or a non-steroidal anti-inflammatory agent such as ibuprofen or naproxen (Motrin or Aleve). These medications should be taken with food to prevent stomach problems. As well as gargling with warm water and salt.   Cough - Common cough medicine ingredients include guaifenesin and dextromethorphan; these are often combined with other medications in over-the-counter cold formulas. Often a cough is worse at night or first in the morning due to post nasal drip from you nose. You can try to sleep at an angle to decrease a cough.   Alternative treatments - Heated, humidified air can improve symptoms of nasal congestion and runny nose, and causes few to no side effects. A number of alternative products, including vitamin C, doubling up on your vitamin D and herbal products such as echinacea, may help. Certain products, such as nasal gels that contain zinc (eg, Zicam), have been associated with a permanent loss of smell.  Antibiotics - Antibiotics should not be used to treat an uncomplicated common cold. As noted above, colds are caused by viruses. Antibiotics treat bacterial, not viral infections. Some viruses that cause the common cold can also depress the immune system or cause swelling in the lining of the nose or airways; this can, in turn, lead to a bacterial infection. Often you need to give your body 7 days to fight off a common cold while treating the symptoms with the medications listed above. If after 7 days your symptoms are not improving, you are getting worse, you have shortness of breath, chest pain, a fever of over 103 you should seek medical help immediately.    PREVENTION IS THE BEST MEDICINE - Hand washing is an essential and highly effective way to prevent the spread of infection.  Alcohol-based hand rubs are a good alternative for disinfecting hands if a sink is not available.  Hands should be washed before preparing food and eating and after coughing, blowing the nose, or sneezing. While it is not always possible to limit contact with people who may be infected with a cold, touching the eyes, nose, or mouth after direct contact should be avoided when possible. Sneezing/coughing into the sleeve of one's clothing (at the inner elbow) is another means of containing sprays of saliva and secretions and does not contaminate the hands.

## 2013-03-30 NOTE — Progress Notes (Signed)
HPI Patient presents for 3 month follow up with hypertension, hyperlipidemia, prediabetes and vitamin D. Patient's blood pressure has been controlled at home, today their BP is BP: 122/72 mmHg  Patient denies chest pain, shortness of breath, dizziness.  Patient's cholesterol is diet controlled. In addition they are on lipitor and denies myalgias. The cholesterol last visit was LDL 89, Trigs 164.  The patient has been working on diet and exercise for prediabetes, and denies changes in vision, polys, and paresthesias. A1C 6.3 and insulin was 54.  Weight is up 6 lbs from last time, she states she lost the phenteramine prescription that Dr. Oneta Rack gave to her last time.  Patient is on Vitamin D supplement.   Current Medications:  Current Outpatient Prescriptions on File Prior to Visit  Medication Sig Dispense Refill  . ALPRAZolam (XANAX) 1 MG tablet Take 1 mg by mouth 2 (two) times daily as needed for anxiety.      Marland Kitchen aspirin 81 MG tablet Take 81 mg by mouth daily.      Marland Kitchen atorvastatin (LIPITOR) 80 MG tablet Take 80 mg by mouth daily.      . cholecalciferol (VITAMIN D) 1000 UNITS tablet Take 10,000 Units by mouth daily.       Marland Kitchen FLUoxetine (PROZAC) 20 MG capsule Take 1 capsule (20 mg total) by mouth daily.  90 capsule  0  . furosemide (LASIX) 40 MG tablet Take 40 mg by mouth 2 (two) times daily.      Marland Kitchen KRILL OIL PO Take by mouth.      . Magnesium Oxide 250 MG TABS Take by mouth.      . phentermine (ADIPEX-P) 37.5 MG tablet Take 37.5 mg by mouth daily before breakfast.      . potassium chloride SA (K-DUR,KLOR-CON) 20 MEQ tablet 2 po BID  360 tablet  1   No current facility-administered medications on file prior to visit.   Medical History:  Past Medical History  Diagnosis Date  . Hypertension   . Hyperlipidemia   . GERD (gastroesophageal reflux disease)   . Depression   . Obesity   . Prediabetes   . Vitamin D deficiency    Allergies:  Allergies  Allergen Reactions  . Penicillins    Rash  . Wellbutrin [Bupropion]     ROS Constitutional: Denies fever, chills, headaches, insomnia, fatigue, night sweats Eyes: Denies redness, blurred vision, diplopia, discharge, itchy, watery eyes.  ENT: Denies congestion, post nasal drip, sore throat, earache, dental pain, Tinnitus, Vertigo, Sinus pain, snoring.  Cardio: Denies chest pain, palpitations, irregular heartbeat, dyspnea, diaphoresis, orthopnea, PND, claudication, edema Respiratory: denies cough, shortness of breath, wheezing.  Gastrointestinal: Denies dysphagia, heartburn, AB pain/ cramps, N/V, diarrhea, constipation, hematemesis, melena, hematochezia,  hemorrhoids Genitourinary: Denies dysuria, frequency, urgency, nocturia, hesitancy, discharge, hematuria, flank pain Musculoskeletal: Denies myalgia, stiffness, pain, swelling and strain/sprain. Skin: Denies pruritis, rash, changing in skin lesion Neuro: Denies Weakness, tremor, incoordination, spasms, pain Psychiatric: Denies confusion, memory loss, sensory loss Endocrine: Denies change in weight, skin, hair change, nocturia Diabetic Polys, Denies visual blurring, hyper /hypo glycemic episodes, and paresthesia, Heme/Lymph: Denies Excessive bleeding, bruising, enlarged lymph nodes  Family history- Review and unchanged Social history- Review and unchanged Physical Exam: Filed Vitals:   03/30/13 1025  BP: 122/72  Pulse: 80  Temp: 98.1 F (36.7 C)  Resp: 16   Filed Weights   03/30/13 1025  Weight: 212 lb (96.163 kg)   General Appearance: Well nourished, in no apparent distress. Eyes: PERRLA, EOMs, conjunctiva  no swelling or erythema Sinuses: No Frontal/maxillary tenderness ENT/Mouth: Ext aud canals clear, TMs without erythema, bulging. No erythema, swelling, or exudate on post pharynx.  Tonsils not swollen or erythematous. Hearing normal.  Neck: Supple, thyroid normal.  Respiratory: Respiratory effort normal, BS equal bilaterally without rales, rhonchi, wheezing or  stridor.  Cardio: RRR with no MRGs. Brisk peripheral pulses without edema.  Abdomen: Soft, obese, + BS.  Non tender, no guarding, rebound, hernias, masses. Lymphatics: Non tender without lymphadenopathy.  Musculoskeletal: Full ROM, 5/5 strength, normal gait.  Skin: Warm, dry without rashes, lesions, ecchymosis.  Neuro: Cranial nerves intact. Normal muscle tone, no cerebellar symptoms. Sensation intact.  Psych: Awake and oriented X 3, normal affect, Insight and Judgment appropriate.   Assessment and Plan:  Hypertension: Continue medication, monitor blood pressure at home.  Continue DASH diet. Cholesterol: Continue diet and exercise. Check cholesterol.  Pre-diabetes-Continue diet and exercise. Check A1C Vitamin D Def- check level and continue medications.  Weight loss- Phenteramine 37.5mg  #30 2 RF  Continue diet and meds as discussed. Further disposition pending results of labs.  Quentin Mulling 10:36 AM

## 2013-03-31 LAB — VITAMIN D 25 HYDROXY (VIT D DEFICIENCY, FRACTURES): Vit D, 25-Hydroxy: 78 ng/mL (ref 30–89)

## 2013-04-14 ENCOUNTER — Other Ambulatory Visit: Payer: Self-pay | Admitting: *Deleted

## 2013-04-14 MED ORDER — ESOMEPRAZOLE MAGNESIUM 40 MG PO CPDR: 40.0000 mg | DELAYED_RELEASE_CAPSULE | Freq: Every day | ORAL | Status: DC

## 2013-05-06 ENCOUNTER — Telehealth: Payer: Self-pay | Admitting: Internal Medicine

## 2013-05-06 NOTE — Telephone Encounter (Signed)
Pt called has had nose bleeds for three days. Two mornings when brushing her teeth, once at lunch, and once it woke her up from sleep. Patient able to stop bleed with pressure and tissue for 5 minutes.  Using steam to put moisture in the house, and sitting in hot shower. Also put some valsaline in nose when going out.  Patient was advised to make a office visit by Loree FeeMelissa Smith.  Patient said she would it the moisture did not take care of nose bleeds.

## 2013-06-26 ENCOUNTER — Ambulatory Visit: Payer: Self-pay | Admitting: Emergency Medicine

## 2013-06-27 ENCOUNTER — Other Ambulatory Visit: Payer: Self-pay | Admitting: Physician Assistant

## 2013-06-29 ENCOUNTER — Ambulatory Visit: Payer: Self-pay | Admitting: Emergency Medicine

## 2013-06-30 ENCOUNTER — Encounter: Payer: Self-pay | Admitting: Emergency Medicine

## 2013-06-30 ENCOUNTER — Ambulatory Visit (INDEPENDENT_AMBULATORY_CARE_PROVIDER_SITE_OTHER): Payer: Commercial Managed Care - HMO | Admitting: Emergency Medicine

## 2013-06-30 ENCOUNTER — Ambulatory Visit: Payer: Self-pay | Admitting: Emergency Medicine

## 2013-06-30 VITALS — BP 136/82 | HR 68 | Temp 98.2°F | Resp 18 | Ht 63.0 in | Wt 202.0 lb

## 2013-06-30 DIAGNOSIS — Z780 Asymptomatic menopausal state: Secondary | ICD-10-CM

## 2013-06-30 DIAGNOSIS — R7309 Other abnormal glucose: Secondary | ICD-10-CM

## 2013-06-30 DIAGNOSIS — Z789 Other specified health status: Secondary | ICD-10-CM

## 2013-06-30 DIAGNOSIS — Z0000 Encounter for general adult medical examination without abnormal findings: Secondary | ICD-10-CM

## 2013-06-30 DIAGNOSIS — Z1331 Encounter for screening for depression: Secondary | ICD-10-CM

## 2013-06-30 DIAGNOSIS — Z23 Encounter for immunization: Secondary | ICD-10-CM

## 2013-06-30 DIAGNOSIS — Z1211 Encounter for screening for malignant neoplasm of colon: Secondary | ICD-10-CM

## 2013-06-30 DIAGNOSIS — I10 Essential (primary) hypertension: Secondary | ICD-10-CM

## 2013-06-30 DIAGNOSIS — E782 Mixed hyperlipidemia: Secondary | ICD-10-CM

## 2013-06-30 DIAGNOSIS — R5381 Other malaise: Secondary | ICD-10-CM

## 2013-06-30 DIAGNOSIS — R5383 Other fatigue: Secondary | ICD-10-CM

## 2013-06-30 LAB — HEMOGLOBIN A1C
Hgb A1c MFr Bld: 6.1 % — ABNORMAL HIGH (ref <5.7)
Mean Plasma Glucose: 128 mg/dL — ABNORMAL HIGH (ref <117)

## 2013-06-30 LAB — CBC WITH DIFFERENTIAL/PLATELET
Basophils Absolute: 0.1 10*3/uL (ref 0.0–0.1)
Basophils Relative: 1 % (ref 0–1)
Eosinophils Absolute: 0.1 10*3/uL (ref 0.0–0.7)
Eosinophils Relative: 1 % (ref 0–5)
HCT: 43.5 % (ref 36.0–46.0)
Hemoglobin: 14.7 g/dL (ref 12.0–15.0)
Lymphocytes Relative: 47 % — ABNORMAL HIGH (ref 12–46)
Lymphs Abs: 3.9 10*3/uL (ref 0.7–4.0)
MCH: 30.3 pg (ref 26.0–34.0)
MCHC: 33.8 g/dL (ref 30.0–36.0)
MCV: 89.7 fL (ref 78.0–100.0)
Monocytes Absolute: 0.4 10*3/uL (ref 0.1–1.0)
Monocytes Relative: 5 % (ref 3–12)
Neutro Abs: 3.9 10*3/uL (ref 1.7–7.7)
Neutrophils Relative %: 46 % (ref 43–77)
Platelets: 233 10*3/uL (ref 150–400)
RBC: 4.85 MIL/uL (ref 3.87–5.11)
RDW: 13.5 % (ref 11.5–15.5)
WBC: 8.4 10*3/uL (ref 4.0–10.5)

## 2013-06-30 MED ORDER — FLUOXETINE HCL 20 MG PO CAPS: ORAL_CAPSULE | ORAL | Status: DC

## 2013-06-30 MED ORDER — ALPRAZOLAM 1 MG PO TABS: 1.0000 mg | ORAL_TABLET | Freq: Two times a day (BID) | ORAL | Status: DC | PRN

## 2013-06-30 NOTE — Patient Instructions (Signed)
Dementia Dementia is a general term for problems with brain function. A person with dementia has memory loss and a hard time with at least one other brain function such as thinking, speaking, or problem solving. Dementia can affect social functioning, how you do your job, your mood, or your personality. The changes may be hidden for a long time. The earliest forms of this disease are usually not detected by family or friends. Dementia can be:  Irreversible.  Potentially reversible.  Partially reversible.  Progressive. This means it can get worse over time. CAUSES  Irreversible dementia causes may include:  Degeneration of brain cells (Alzheimer's disease or lewy body dementia).  Multiple small strokes (vascular dementia).  Infection (chronic meningitis or Creutzfelt-Jakob disease).  Frontotemporal dementia. This affects younger people, age 40 to 70, compared to those who have Alzheimer's disease.  Dementia associated with other disorders like Parkinson's disease, Huntington's disease, or HIV-associated dementia. Potentially or partially reversible dementia causes may include:  Medicines.  Metabolic causes such as excessive alcohol intake, vitamin B12 deficiency, or thyroid disease.  Masses or pressure in the brain such as a tumor, blood clot, or hydrocephalus. SYMPTOMS  Symptoms are often hard to detect. Family members or coworkers may not notice them early in the disease process. Different people with dementia may have different symptoms. Symptoms can include:  A hard time with memory, especially recent memory. Long-term memory may not be impaired.  Asking the same question multiple times or forgetting something someone just said.  A hard time speaking your thoughts or finding certain words.  A hard time solving problems or performing familiar tasks (such as how to use a telephone).  Sudden changes in mood.  Changes in personality, especially increasing moodiness or  mistrust.  Depression.  A hard time understanding complex ideas that were never a problem in the past. DIAGNOSIS  There are no specific tests for dementia.   Your caregiver may recommend a thorough evaluation. This is because some forms of dementia can be reversible. The evaluation will likely include a physical exam and getting a detailed history from you and a family member. The history often gives the best clues and suggestions for a diagnosis.  Memory testing may be done. A detailed brain function evaluation called neuropsychologic testing may be helpful.  Lab tests and brain imaging (such as a CT scan or MRI scan) are sometimes important.  Sometimes observation and re-evaluation over time is very helpful. TREATMENT  Treatment depends on the cause.   If the problem is a vitamin deficiency, it may be helped or cured with supplements.  For dementias such as Alzheimer's disease, medicines are available to stabilize or slow the course of the disease. There are no cures for this type of dementia.  Your caregiver can help direct you to groups, organizations, and other caregivers to help with decisions in the care of you or your loved one. HOME CARE INSTRUCTIONS The care of individuals with dementia is varied and dependent upon the progression of the dementia. The following suggestions are intended for the person living with, or caring for, the person with dementia.  Create a safe environment.  Remove the locks on bathroom doors to prevent the person from accidentally locking himself or herself in.  Use childproof latches on kitchen cabinets and any place where cleaning supplies, chemicals, or alcohol are kept.  Use childproof covers in unused electrical outlets.  Install childproof devices to keep doors and windows secured.  Remove stove knobs or install safety   knobs and an automatic shut-off on the stove.  Lower the temperature on water heaters.  Label medicines and keep them  locked up.  Secure knives, lighters, matches, power tools, and guns, and keep these items out of reach.  Keep the house free from clutter. Remove rugs or anything that might contribute to a fall.  Remove objects that might break and hurt the person.  Make sure lighting is good, both inside and outside.  Install grab rails as needed.  Use a monitoring device to alert you to falls or other needs for help.  Reduce confusion.  Keep familiar objects and people around.  Use night lights or dim lights at night.  Label items or areas.  Use reminders, notes, or directions for daily activities or tasks.  Keep a simple, consistent routine for waking, meals, bathing, dressing, and bedtime.  Create a calm, quiet environment.  Place large clocks and calendars prominently.  Display emergency numbers and home address near all telephones.  Use cues to establish different times of the day. An example is to open curtains to let the natural light in during the day.   Use effective communication.  Choose simple words and short sentences.  Use a gentle, calm tone of voice.  Be careful not to interrupt.  If the person is struggling to find a word or communicate a thought, try to provide the word or thought.  Ask one question at a time. Allow the person ample time to answer questions. Repeat the question again if the person does not respond.  Reduce nighttime restlessness.  Provide a comfortable bed.  Have a consistent nighttime routine.  Ensure a regular walking or physical activity schedule. Involve the person in daily activities as much as possible.  Limit napping during the day.  Limit caffeine.  Attend social events that stimulate rather than overwhelm the senses.  Encourage good nutrition and hydration.  Reduce distractions during meal times and snacks.  Avoid foods that are too hot or too cold.  Monitor chewing and swallowing ability.  Continue with routine vision,  hearing, dental, and medical screenings.  Only give over-the-counter or prescription medicines as directed by the caregiver.  Monitor driving abilities. Do not allow the person to drive when safe driving is no longer possible.  Register with an identification program which could provide location assistance in the event of a missing person situation. SEEK MEDICAL CARE IF:   New behavioral problems start such as moodiness, aggressiveness, or seeing things that are not there (hallucinations).  Any new problem with brain function happens. This includes problems with balance, speech, or falling a lot.  Problems with swallowing develop.  Any symptoms of other illness happen. Small changes or worsening in any aspect of brain function can be a sign that the illness is getting worse. It can also be a sign of another medical illness such as infection. Seeing a caregiver right away is important. SEEK IMMEDIATE MEDICAL CARE IF:   A fever develops.  New or worsened confusion develops.  New or worsened sleepiness develops.  Staying awake becomes hard to do. Document Released: 09/19/2000 Document Revised: 06/18/2011 Document Reviewed: 08/21/2010 [facility] Patient Information 2014 [place], [place]. Insomnia Insomnia is frequent trouble falling and/or staying asleep. Insomnia can be a long term problem or a short term problem. Both are common. Insomnia can be a short term problem when the wakefulness is related to a certain stress or worry. Long term insomnia is often related to ongoing stress during waking hours  and/or poor sleeping habits. Overtime, sleep deprivation itself can make the problem worse. Every little thing feels more severe because you are overtired and your ability to cope is decreased. CAUSES   Stress, anxiety, and depression.  Poor sleeping habits.  Distractions such as TV in the bedroom.  Naps close to bedtime.  Engaging in emotionally charged conversations before  bed.  Technical reading before sleep.  Alcohol and other sedatives. They may make the problem worse. They can hurt normal sleep patterns and normal dream activity.  Stimulants such as caffeine for several hours prior to bedtime.  Pain syndromes and shortness of breath can cause insomnia.  Exercise late at night.  Changing time zones may cause sleeping problems (jet lag). It is sometimes helpful to have someone observe your sleeping patterns. They should look for periods of not breathing during the night (sleep apnea). They should also look to see how long those periods last. If you live alone or observers are uncertain, you can also be observed at a sleep clinic where your sleep patterns will be professionally monitored. Sleep apnea requires a checkup and treatment. Give your caregivers your medical history. Give your caregivers observations your family has made about your sleep.  SYMPTOMS   Not feeling rested in the morning.  Anxiety and restlessness at bedtime.  Difficulty falling and staying asleep. TREATMENT   Your caregiver may prescribe treatment for an underlying medical disorders. Your caregiver can give advice or help if you are using alcohol or other drugs for self-medication. Treatment of underlying problems will usually eliminate insomnia problems.  Medications can be prescribed for short time use. They are generally not recommended for lengthy use.  Over-the-counter sleep medicines are not recommended for lengthy use. They can be habit forming.  You can promote easier sleeping by making lifestyle changes such as:  Using relaxation techniques that help with breathing and reduce muscle tension.  Exercising earlier in the day.  Changing your diet and the time of your last meal. No night time snacks.  Establish a regular time to go to bed.  Counseling can help with stressful problems and worry.  Soothing music and white noise may be helpful if there are background  noises you cannot remove.  Stop tedious detailed work at least one hour before bedtime. HOME CARE INSTRUCTIONS   Keep a diary. Inform your caregiver about your progress. This includes any medication side effects. See your caregiver regularly. Take note of:  Times when you are asleep.  Times when you are awake during the night.  The quality of your sleep.  How you feel the next day. This information will help your caregiver care for you.  Get out of bed if you are still awake after 15 minutes. Read or do some quiet activity. Keep the lights down. Wait until you feel sleepy and go back to bed.  Keep regular sleeping and waking hours. Avoid naps.  Exercise regularly.  Avoid distractions at bedtime. Distractions include watching television or engaging in any intense or detailed activity like attempting to balance the household checkbook.  Develop a bedtime ritual. Keep a familiar routine of bathing, brushing your teeth, climbing into bed at the same time each night, listening to soothing music. Routines increase the success of falling to sleep faster.  Use relaxation techniques. This can be using breathing and muscle tension release routines. It can also include visualizing peaceful scenes. You can also help control troubling or intruding thoughts by keeping your mind occupied with boring  or repetitive thoughts like the old concept of counting sheep. You can make it more creative like imagining planting one beautiful flower after another in your backyard garden.  During your day, work to eliminate stress. When this is not possible use some of the previous suggestions to help reduce the anxiety that accompanies stressful situations. MAKE SURE YOU:   Understand these instructions.  Will watch your condition.  Will get help right away if you are not doing well or get worse. Document Released: 03/23/2000 Document Revised: 06/18/2011 Document Reviewed: 04/23/2007 [facility] Patient  Information 2014 [place], [place].

## 2013-06-30 NOTE — Progress Notes (Signed)
Patient ID: [name], female   DOB: 10-04-1945, 68 y.o.   MRN: [id number] Subjective:     [name] is a 68 y.o. female who presents for Medicare Annual Wellness Visit and 3 month follow up on hypertension, prediabetes, hyperlipidemia, vitamin D def.  Date of last medicare wellness visit is unknown.   She is concerned with mild decrease memory. She is mildly stressed. She notes occasionally depressed. She occasionally takes 2 xanax for sleep but usually takes 1.  Her blood pressure has been controlled at home, today their BP is BP: 136/82 mmHg She does not workout. She denies chest pain, shortness of breath, dizziness.  She is on cholesterol medication and denies myalgias. Her cholesterol is at goal. The cholesterol last visit was:   Lab Results  Component Value Date   CHOL 154 06/30/2013   HDL 45 06/30/2013   LDLCALC 65 06/30/2013   TRIG 220* 06/30/2013   CHOLHDL 3.4 06/30/2013   She has been working on diet and exercise for prediabetes, and denies foot ulcerations, paresthesia of the feet and weight loss. Last A1C in the office was:  Lab Results  Component Value Date   HGBA1C 6.1* 06/30/2013   Patient is on Vitamin D supplement.  Names of Other Physician/Practitioners you currently use: Patient Care Team: Lucky Cowboy, MD as PCP - General (Internal Medicine) Amy Y [place], MD as Consulting Physician (Dermatology) Chucky May, MD as Consulting Physician (Ophthalmology) DOES NOT HAVE DENTIST due to dentures  Medication Review Current Outpatient Prescriptions on File Prior to Visit  Medication Sig Dispense Refill  . aspirin 81 MG tablet Take 81 mg by mouth daily.      Marland Kitchen atorvastatin (LIPITOR) 80 MG tablet Take 80 mg by mouth daily.      . cholecalciferol (VITAMIN D) 1000 UNITS tablet Take 10,000 Units by mouth daily.       Marland Kitchen esomeprazole (NEXIUM) 40 MG capsule Take 1 capsule (40 mg total) by mouth daily.  90 capsule  0  . furosemide (LASIX) 40 MG tablet Take 40 mg by  mouth 2 (two) times daily.      Marland Kitchen GARLIC PO Take by mouth daily.      Marland Kitchen KRILL OIL PO Take by mouth.      . Magnesium Oxide 250 MG TABS Take by mouth.      . potassium chloride SA (K-DUR,KLOR-CON) 20 MEQ tablet 2 po BID  360 tablet  1   No current facility-administered medications on file prior to visit.    Current Problems (verified) Patient Active Problem List   Diagnosis Date Noted  . Hypertension   . Hyperlipidemia   . Obesity   . Prediabetes   . Vitamin D deficiency     Screening Tests Health Maintenance  Topic Date Due  . Colonoscopy  05/12/1993  . Zostavax  05/13/2003  . Mammogram  03/27/2010  . Influenza Vaccine  11/07/2013  . Tetanus/tdap  04/28/2015  . Pneumococcal Polysaccharide Vaccine Age 37 And Over  Completed     Immunization History  Administered Date(s) Administered  . Influenza-Unspecified 12/29/2012  . Pneumococcal-Unspecified 07/21/2008  . Td 04/27/2005    Preventative care: Last colonoscopy: Never Last mammogram: 08/04/07 Last pap smear/pelvic exam: >5 years ago declines further DEXA:Never  Prior vaccinations: TD or Tdap: 2007  Influenza: 12/29/12 Pneumococcal: 07/21/08 Shingles/Zostavax: Declines due to cost  History reviewed:  Past Medical History  Diagnosis Date  . Hypertension   . Hyperlipidemia   . GERD (gastroesophageal  reflux disease)   . Depression   . Obesity   . Prediabetes   . Vitamin D deficiency    Past Surgical History  Procedure Laterality Date  . Skin cancer excision    . Carpal tunnel release Bilateral   . Abdominal hysterectomy    . Lasik    . Pilonidal cyst excision  1978   History  Substance Use Topics  . Smoking status: Former Smoker    Quit date: 07/01/1998  . Smokeless tobacco: Never Used  . Alcohol Use: No   Family History  Problem Relation Age of Onset  . Diabetes Mother   . Heart disease Mother   . Hypertension Mother   . Heart disease Father      Risk Factors: Osteoporosis: postmenopausal  estrogen deficiency History of fracture in the past year: no  Tobacco History  Substance Use Topics  . Smoking status: Former Smoker    Quit date: 07/01/1998  . Smokeless tobacco: Never Used  . Alcohol Use: No   She does not smoke.  Patient is not a former smoker. Are there smokers in your home (other than you)?  No  Alcohol Current alcohol use: social drinker  Caffeine Current caffeine use: denies use  Exercise Exercise limitations: The patient has no exercise limitations. Current exercise: housecleaning, no regular exercise and walking  Nutrition/Diet Current diet: in general, a "healthy" diet    Cardiac risk factors: dyslipidemia, hypertension and sedentary lifestyle.  Depression Screen Nurse depression screen reviewed.  (Note: if answer to either of the following is "Yes", a more complete depression screening is indicated)   Q1: Over the past two weeks, have you felt down, depressed or hopeless? No  Q2: Over the past two weeks, have you felt little interest or pleasure in doing things? No  Have you lost interest or pleasure in daily life? No  Do you often feel hopeless? No  Do you cry easily over simple problems? No  Activities of Daily Living Nurse ADLs screen reviewed.  In your present state of health, do you have any difficulty performing the following activities?:  Driving? no Managing money?  No Feeding yourself? No Getting from bed to chair? No Climbing a flight of stairs? No Preparing food and eating?: No Bathing or showering? No Getting dressed: No Getting to the toilet? No Using the toilet:No Moving around from place to place: No In the past year have you fallen or had a near fall?:No   Are you sexually active?  No  Do you have more than one partner?  No  Vision Difficulties: No  Hearing Difficulties: No Do you often ask people to speak up or repeat themselves? No Do you experience ringing or noises in your ears? No Do you have difficulty  understanding soft or whispered voices? No  Cognition  Do you feel that you have a problem with memory?Yes  Do you often misplace items? No  Do you feel safe at home?  Yes  Advanced directives Does patient have a Health Care Power of Attorney? Not sure Does patient have a Living Will? Yes    Objective:     Vision and hearing screens reviewed.   Blood pressure 136/82, pulse 68, temperature 98.2 F (36.8 C), temperature source Temporal, resp. rate 18, height 5\' 3"  (1.6 m), weight 202 lb (91.627 kg). Body mass index is 35.79 kg/(m^2).  General appearance: alert, no distress, WD/WN,  female Cognitive Testing  Alert? Yes  Normal Appearance?Yes  Oriented to person? Yes  Place? Yes   Time? Yes  Recall of three objects?  Yes  Can perform simple calculations? Yes  Displays appropriate judgment?Yes  Can read the correct time from a watch face?Yes  HEENT: normocephalic, sclerae anicteric, TMs pearly, nares patent, no discharge or erythema, pharynx normal Oral cavity: MMM, no lesions Neck: supple, no lymphadenopathy, no thyromegaly, no masses Heart: RRR, normal S1, S2, no murmurs Lungs: CTA bilaterally, no wheezes, rhonchi, or rales Abdomen: +bs, soft, non tender, non distended, no masses, no hepatomegaly, no splenomegaly Musculoskeletal: nontender, no swelling, no obvious deformity Extremities: no edema, no cyanosis, no clubbing Pulses: 2+ symmetric, upper and lower extremities, normal cap refill Neurological: alert, oriented x 3, CN2-12 intact, strength normal upper extremities and lower extremities, sensation normal throughout, DTRs 2+ throughout, no cerebellar signs, gait normal Psychiatric: normal affect, behavior normal, pleasant  Breast: nontender, no masses or lumps, no skin changes, no nipple discharge or inversion, no axillary lymphadenopathy Gyn: Normal external genitalia without lesions, vagina with normal mucosa, cervix without lesions, no cervical motion tenderness, no  abnormal vaginal discharge.  Uterus and adnexa not enlarged, nontender, no masses.  Pap performed.   Rectal:    Assessment:  1.  3 month F/U for HTN, Cholesterol, Pre-Dm, D. Deficient. Needs healthy diet, cardio QD and obtain healthy weight. Check Labs, Check BP if >130/80 call office  2. Fatigue vs insomnia- check labs, increase activity and H2O, Insomnia- Sleep hygiene discussed, f/u with results  3.? Memmory- Change to crestor 10 mg or decrease dose to 1/2 current tab Plan:   During the course of the visit the patient was educated and counseled about appropriate screening and preventive services including:    Screening mammography  Bone densitometry screening  Colorectal cancer screening  Diabetes screening  Nutrition counseling   Screening recommendations, referrals:  Vaccinations: Tdap vaccine no  Influenza vaccine no Pneumococcal vaccine no Shingles vaccine no due to cost Hep B vaccine no  Nutrition assessed and recommended  Colonoscopy yes Mammogram yes Pap smear no pt refuses Pelvic exam no pt refuses Recommended yearly ophthalmology/optometry visit for glaucoma screening and checkup Recommended yearly dental visit for hygiene and checkup Advanced directives - yes  Conditions/risks identified: BMI: Discussed weight loss, diet, and increase physical activity.  Increase physical activity: AHA recommends 150 minutes of physical activity a week.  Medications reviewed DEXA- ordered PRE-Diabetes is at goal, ACE/ARB therapy: No, Reason not on Ace Inhibitor/ARB therapy:  not diabetic Urinary Incontinence is not an issue: discussed non pharmacology and pharmacology options.  Fall risk: low- discussed PT, home fall assessment, medications.   Medicare Attestation I have personally reviewed: The patient's medical and social history Their use of alcohol, tobacco or illicit drugs Their current medications and supplements The patient's functional ability including  ADLs,fall risks, home safety risks, cognitive, and hearing and visual impairment Diet and physical activities Evidence for depression or mood disorders  The patient's weight, height, BMI, and visual acuity have been recorded in the chart.  I have made referrals, counseling, and provided education to the patient based on review of the above and I have provided the patient with a written personalized care plan for preventive services.     Loree Fee, R, PA-C   07/01/2013    CPT W0981 first AWV CPT [id number] subsequent AWV

## 2013-07-01 ENCOUNTER — Ambulatory Visit: Payer: Self-pay | Admitting: Emergency Medicine

## 2013-07-01 LAB — HEPATIC FUNCTION PANEL
ALK PHOS: 83 U/L (ref 39–117)
ALT: 35 U/L (ref 0–35)
AST: 24 U/L (ref 0–37)
Albumin: 4.5 g/dL (ref 3.5–5.2)
BILIRUBIN INDIRECT: 0.4 mg/dL (ref 0.2–1.2)
BILIRUBIN TOTAL: 0.5 mg/dL (ref 0.2–1.2)
Bilirubin, Direct: 0.1 mg/dL (ref 0.0–0.3)
Total Protein: 7.1 g/dL (ref 6.0–8.3)

## 2013-07-01 LAB — BASIC METABOLIC PANEL WITH GFR
BUN: 11 mg/dL (ref 6–23)
CALCIUM: 10.2 mg/dL (ref 8.4–10.5)
CO2: 28 mEq/L (ref 19–32)
Chloride: 103 mEq/L (ref 96–112)
Creat: 1 mg/dL (ref 0.50–1.10)
GFR, EST AFRICAN AMERICAN: 66 mL/min
GFR, EST NON AFRICAN AMERICAN: 57 mL/min — AB
Glucose, Bld: 92 mg/dL (ref 70–99)
POTASSIUM: 4.3 meq/L (ref 3.5–5.3)
SODIUM: 141 meq/L (ref 135–145)

## 2013-07-01 LAB — TSH: TSH: 1.606 u[IU]/mL (ref 0.350–4.500)

## 2013-07-01 LAB — LIPID PANEL
CHOL/HDL RATIO: 3.4 ratio
Cholesterol: 154 mg/dL (ref 0–200)
HDL: 45 mg/dL (ref >39)
LDL CALC: 65 mg/dL (ref 0–99)
Triglycerides: 220 mg/dL — ABNORMAL HIGH (ref <150)
VLDL: 44 mg/dL — AB (ref 0–40)

## 2013-07-01 LAB — INSULIN, FASTING: INSULIN FASTING, SERUM: 68 u[IU]/mL — AB (ref 3–28)

## 2013-07-11 ENCOUNTER — Other Ambulatory Visit: Payer: Self-pay | Admitting: Internal Medicine

## 2013-07-13 ENCOUNTER — Other Ambulatory Visit: Payer: Self-pay | Admitting: Internal Medicine

## 2013-08-03 ENCOUNTER — Encounter: Payer: Self-pay | Admitting: Internal Medicine

## 2013-08-04 ENCOUNTER — Other Ambulatory Visit: Payer: Self-pay | Admitting: Internal Medicine

## 2013-08-17 ENCOUNTER — Other Ambulatory Visit (HOSPITAL_COMMUNITY): Payer: Medicare Other

## 2013-08-17 ENCOUNTER — Ambulatory Visit (HOSPITAL_COMMUNITY): Payer: Medicare Other

## 2013-08-26 ENCOUNTER — Ambulatory Visit (HOSPITAL_COMMUNITY)
Admission: RE | Admit: 2013-08-26 | Discharge: 2013-08-26 | Disposition: A | Discharge status: Home / Self Care | Payer: Medicare HMO | Source: Ambulatory Visit | Attending: Emergency Medicine | Admitting: Emergency Medicine

## 2013-08-26 ENCOUNTER — Encounter (INDEPENDENT_AMBULATORY_CARE_PROVIDER_SITE_OTHER): Payer: Self-pay

## 2013-08-26 DIAGNOSIS — Z780 Asymptomatic menopausal state: Secondary | ICD-10-CM

## 2013-08-26 DIAGNOSIS — Z13820 Encounter for screening for osteoporosis: Secondary | ICD-10-CM | POA: Insufficient documentation

## 2013-09-01 ENCOUNTER — Other Ambulatory Visit: Payer: Self-pay | Admitting: *Deleted

## 2013-09-01 MED ORDER — ALENDRONATE SODIUM 70 MG PO TABS: 70.0000 mg | ORAL_TABLET | ORAL | Status: DC

## 2013-10-02 ENCOUNTER — Ambulatory Visit (INDEPENDENT_AMBULATORY_CARE_PROVIDER_SITE_OTHER): Payer: Commercial Managed Care - HMO | Admitting: Internal Medicine

## 2013-10-02 ENCOUNTER — Encounter: Payer: Self-pay | Admitting: Internal Medicine

## 2013-10-02 VITALS — BP 124/80 | HR 72 | Temp 98.1°F | Resp 16 | Ht 63.5 in | Wt 200.6 lb

## 2013-10-02 DIAGNOSIS — R7303 Prediabetes: Secondary | ICD-10-CM

## 2013-10-02 DIAGNOSIS — E559 Vitamin D deficiency, unspecified: Secondary | ICD-10-CM

## 2013-10-02 DIAGNOSIS — Z1212 Encounter for screening for malignant neoplasm of rectum: Secondary | ICD-10-CM

## 2013-10-02 DIAGNOSIS — E785 Hyperlipidemia, unspecified: Secondary | ICD-10-CM

## 2013-10-02 DIAGNOSIS — Z789 Other specified health status: Secondary | ICD-10-CM

## 2013-10-02 DIAGNOSIS — I10 Essential (primary) hypertension: Secondary | ICD-10-CM

## 2013-10-02 DIAGNOSIS — Z79899 Other long term (current) drug therapy: Secondary | ICD-10-CM

## 2013-10-02 DIAGNOSIS — Z1331 Encounter for screening for depression: Secondary | ICD-10-CM

## 2013-10-02 DIAGNOSIS — Z0000 Encounter for general adult medical examination without abnormal findings: Secondary | ICD-10-CM

## 2013-10-02 LAB — CBC WITH DIFFERENTIAL/PLATELET
Basophils Absolute: 0 10*3/uL (ref 0.0–0.1)
Basophils Relative: 0 % (ref 0–1)
EOS PCT: 1 % (ref 0–5)
Eosinophils Absolute: 0.1 10*3/uL (ref 0.0–0.7)
HEMATOCRIT: 41.3 % (ref 36.0–46.0)
Hemoglobin: 14.2 g/dL (ref 12.0–15.0)
LYMPHS PCT: 49 % — AB (ref 12–46)
Lymphs Abs: 3.5 10*3/uL (ref 0.7–4.0)
MCH: 29.5 pg (ref 26.0–34.0)
MCHC: 34.4 g/dL (ref 30.0–36.0)
MCV: 85.9 fL (ref 78.0–100.0)
MONO ABS: 0.4 10*3/uL (ref 0.1–1.0)
Monocytes Relative: 5 % (ref 3–12)
Neutro Abs: 3.2 10*3/uL (ref 1.7–7.7)
Neutrophils Relative %: 45 % (ref 43–77)
Platelets: 229 10*3/uL (ref 150–400)
RBC: 4.81 MIL/uL (ref 3.87–5.11)
RDW: 13.8 % (ref 11.5–15.5)
WBC: 7.1 10*3/uL (ref 4.0–10.5)

## 2013-10-02 LAB — HEMOGLOBIN A1C
Hgb A1c MFr Bld: 6.3 % — ABNORMAL HIGH (ref <5.7)
Mean Plasma Glucose: 134 mg/dL — ABNORMAL HIGH (ref <117)

## 2013-10-02 NOTE — Patient Instructions (Addendum)
Recommend the book "the END of DIETING" by Dr Nyra MarketJoel Furman   and the  Book "The END of DIABETES " by Dr Monico HoarJoel Fuhrman  At [email or web address] - get book & Audio CD's      Being diabetic has a  300% increased risk for heart attack, stroke, cancer, and alzheimer- type vascular dementia. It is very important that you work harder with diet by avoiding all foods that are white except chicken & fish. Avoid white rice (brown & wild rice is OK), white potatoes (sweetpotatoes in moderation is OK), White bread or wheat bread or anything made out of white flour like bagels, donuts, rolls, buns, biscuits, cakes, pastries, cookies, pizza crust, and pasta (made from white flour & egg whites) - vegetarian pasta or spinach or wheat pasta is OK. Multigrain breads like Arnold's or Pepperidge Farm, or multigrain sandwich thins or flatbreads.  Diet, exercise and weight loss can reverse and cure diabetes in the early stages.  Diet, exercise and weight loss is very important in the control and prevention of complications of diabetes which affects every system in your body, ie. Brain - dementia/stroke, eyes - glaucoma/blindness, heart - heart attack/heart failure, kidneys - dialysis, stomach - gastric paralysis, intestines - malabsorption, nerves - severe painful neuritis, circulation - gangrene & loss of a leg(s), and finally cancer and Alzheimers.    I recommend avoid fried & greasy foods,  sweets/candy, white rice (brown or wild rice or Quinoa is OK), white potatoes (sweet potatoes are OK) - anything made from white flour - bagels, doughnuts, rolls, buns, biscuits,white and wheat breads, pizza crust and traditional pasta made of white flour & egg white(vegetarian pasta or spinach or wheat pasta is OK).  Multi-grain bread is OK - like multi-grain flat bread or sandwich thins. Avoid alcohol in excess. Exercise is also important.    Eat all the vegetables you want - avoid meat, especially red meat and dairy - especially cheese.  Cheese is  the most concentrated form of trans-fats which is the worst thing to clog up our arteries. Veggie cheese is OK which can be found in the fresh produce section at [employer] or [employer] or [employer]   Osteoarthritis Osteoarthritis is a disease that causes soreness and swelling (inflammation) of a joint. It occurs when the cartilage at the affected joint wears down. Cartilage acts as a cushion, covering the ends of bones where they meet to form a joint. Osteoarthritis is the most common form of arthritis. It often occurs in older people. The joints affected most often by this condition include those in the:  Ends of the fingers.  Thumbs.  Neck.  Lower back.  Knees.  Hips. CAUSES  Over time, the cartilage that covers the ends of bones begins to wear away. This causes bone to rub on bone, producing pain and stiffness in the affected joints.  RISK FACTORS Certain factors can increase your chances of having osteoarthritis, including:  Older age.  Excessive body weight.  Overuse of joints. SIGNS AND SYMPTOMS   Pain, swelling, and stiffness in the joint.  Over time, the joint may lose its normal shape.  Small deposits of bone (osteophytes) may grow on the edges of the joint.  Bits of bone or cartilage can break off and float inside the joint space. This may cause more pain and damage. DIAGNOSIS  Your health care Stevee Valenta will do a physical exam and ask about your symptoms. Various tests may be ordered, such as:  X-rays of the  affected joint.  An MRI scan.  Blood tests to rule out other types of arthritis.  Joint fluid tests. This involves using a needle to draw fluid from the joint and examining the fluid under a microscope. TREATMENT  Goals of treatment are to control pain and improve joint function. Treatment plans may include:  A prescribed exercise program that allows for rest and joint relief.  A weight control plan.  Pain relief techniques, such  as:  Properly applied heat and cold.  Electric pulses delivered to nerve endings under the skin (transcutaneous electrical nerve stimulation, TENS).  Massage.  Certain nutritional supplements.  Medicines to control pain, such as:  Acetaminophen.  Nonsteroidal anti-inflammatory drugs (NSAIDs), such as naproxen.  Narcotic or central-acting agents, such as tramadol.  Corticosteroids. These can be given orally or as an injection.  Surgery to reposition the bones and relieve pain (osteotomy) or to remove loose pieces of bone and cartilage. Joint replacement may be needed in advanced states of osteoarthritis. HOME CARE INSTRUCTIONS   Only take over-the-counter or prescription medicines as directed by your health care Manvir Prabhu. Take all medicines exactly as instructed.  Maintain a healthy weight. Follow your health care Lilliauna Van's instructions for weight control. This may include dietary instructions.  Exercise as directed. Your health care Nachelle Negrette can recommend specific types of exercise. These may include:  Strengthening exercises--These are done to strengthen the muscles that support joints affected by arthritis. They can be performed with weights or with exercise bands to add resistance.  Aerobic activities--These are exercises, such as brisk walking or low-impact aerobics, that get your heart pumping.  Range-of-motion activities--These keep your joints limber.  Balance and agility exercises--These help you maintain daily living skills.  Rest your affected joints as directed by your health care Yazlynn Birkeland.  Follow up with your health care Deangela Randleman as directed. SEEK MEDICAL CARE IF:   Your skin turns red.  You develop a rash in addition to your joint pain.  You have worsening joint pain. SEEK IMMEDIATE MEDICAL CARE IF:  You have a significant loss of weight or appetite.  You have a fever along with joint or muscle aches.  You have night sweats. FOR MORE INFORMATION   National Institute of Arthritis and Musculoskeletal and Skin Diseases: www.niams.http://www.myers.net/nih.gov [employer] A[employer]: https://walker.com/www.nia.nih.gov  You can try Tumeric 600-800 mg  3x da and Bioperine 10 mg  - 2 x da for pain and inflammation  (or get Tumeric with Bioperine in it)  Can get at [email or web address]

## 2013-10-02 NOTE — Progress Notes (Signed)
Patient ID: [name], female   DOB: 10/23/1945, 68 y.o.   MRN: [id number]   Annual Screening Comprehensive Examination  This very nice 68 y.o.WWF presents for complete physical.  Patient has been followed for HTN, Prediabetes, Hyperlipidemia, and Vitamin D Deficiency.    HTN predates since 1995. Patient's BP has been controlled at home. Today's BP: 124/80 mmHg. Patient denies any cardiac symptoms as chest pain, palpitations, shortness of breath, dizziness or ankle swelling.   Patient's hyperlipidemia is controlled with diet and medications. Patient denies myalgias or other medication SE's. Last lipids at goal as below in Mar 2015.   Lab Results  Component Value Date   CHOL 154 06/30/2013   HDL 45 06/30/2013   LDLCALC 65 06/30/2013   TRIG 220* 06/30/2013   CHOLHDL 3.4 06/30/2013    Patient has Morbid Obesity with BMI 35 and consequent prediabetes predating since Sept 2009  With A1c 6.2% and last A1c was 6.1% in Mar 2015. Patient denies reactive hypoglycemic symptoms, visual blurring, diabetic polys or paresthesias.   Finally, patient has history of Vitamin D Deficiency and last vitamin D was 7578 in Dec 2015.   Medication Sig  . alendronate 70 MG  Take 1 tablet (70 mg total) by mouth once a week.   . ALPRAZolam  1 MG tab Take 1 tablet   2  times daily as needed   . aspirin 81 MG tab Take 81 mg by mouth daily.  Marland Kitchen. atorvastatin (LIPITOR) 80 MG tab TAKE 1 TABLET BY MOUTH EVERY DAY  . VITAMIN D 1000 UNITS tab Take 10,000 Units by mouth daily.   Marland Kitchen. esomeprazole  40 MG caps Take 1 capsule (40 mg total) by mouth daily.  Marland Kitchen. FLUoxetine  20 MG cap TAKE 1 CAPSULE (20 MG TOTAL) BY MOUTH DAILY.  . furosemide  40 MG tab TAKE 1 TABLET BY MOUTH TWICE A DAY  . GARLIC  Take by mouth daily.  Marland Kitchen. KLOR-CON  20 MEQ tabl TAKE 1 TABLET BY MOUTH 4 TIMES A DAY  . KRILL OIL  Take by mouth.  . Magnesium Oxide 250 MG  Take by mouth.   Allergies  Allergen Reactions  . Penicillins     Rash  . Wellbutrin [Bupropion]     Past Medical History  Diagnosis Date  . Hypertension   . Hyperlipidemia   . GERD (gastroesophageal reflux disease)   . Depression   . Obesity   . Prediabetes   . Vitamin D deficiency    Past Surgical History  Procedure Laterality Date  . Skin cancer excision    . Carpal tunnel release Bilateral   . Abdominal hysterectomy    . Lasik    . Pilonidal cyst excision  1978   Family History  Problem Relation Age of Onset  . Diabetes Mother   . Heart disease Mother   . Hypertension Mother   . Heart disease Father    History  Substance Use Topics  . Smoking status: Former Smoker    Quit date: 07/01/1998  . Smokeless tobacco: Never Used  . Alcohol Use: No    ROS Constitutional: Denies fever, chills, weight loss/gain, headaches, insomnia, fatigue, night sweats, and change in appetite. Eyes: Denies redness, blurred vision, diplopia, discharge, itchy, watery eyes.  ENT: Denies discharge, congestion, post nasal drip, epistaxis, sore throat, earache, hearing loss, dental pain, Tinnitus, Vertigo, Sinus pain, snoring.  Cardio: Denies chest pain, palpitations, irregular heartbeat, syncope, dyspnea, diaphoresis, orthopnea, PND, claudication, edema Respiratory: denies cough, dyspnea,  DOE, pleurisy, hoarseness, laryngitis, wheezing.  Gastrointestinal: Denies dysphagia, heartburn, reflux, water brash, pain, cramps, nausea, vomiting, bloating, diarrhea, constipation, hematemesis, melena, hematochezia, jaundice, hemorrhoids Genitourinary: Denies dysuria, frequency, urgency, nocturia, hesitancy, discharge, hematuria, flank pain Breast: Breast lumps, nipple discharge, bleeding.  Musculoskeletal: Denies arthralgia, myalgia, stiffness, Jt. Swelling, pain, limp, and strain/sprain. Skin: Denies puritis, rash, hives, warts, acne, eczema, changing in skin lesion Neuro: No weakness, tremor, incoordination, spasms, paresthesia, pain Psychiatric: Denies confusion, memory loss, sensory loss Endocrine:  Denies change in weight, skin, hair change, nocturia, and paresthesia, diabetic polys, visual blurring, hyper / hypo glycemic episodes.  Heme/Lymph: No excessive bleeding, bruising, enlarged lymph nodes.  Physical Exam  BP 124/80  Pulse 72  T 98.1 F   Resp 16  Ht 5' 3.5"   Wt 200 lb 9.6 oz   BMI 34.97 kg/m2  General Appearance: Well nourished, in no apparent distress. Eyes: PERRLA, EOMs, conjunctiva no swelling or erythema, normal fundi and vessels. Sinuses: No frontal/maxillary tenderness ENT/Mouth: EACs patent / TMs  nl. Nares clear without erythema, swelling, mucoid exudates. Oral hygiene is good. No erythema, swelling, or exudate. Tongue normal, non-obstructing. Tonsils not swollen or erythematous. Hearing normal.  Neck: Supple, thyroid normal. No bruits, nodes or JVD. Respiratory: Respiratory effort normal.  BS equal and clear bilateral without rales, rhonci, wheezing or stridor. Cardio: Heart sounds are normal with regular rate and rhythm and no murmurs, rubs or gallops. Peripheral pulses are normal and equal bilaterally without edema. No aortic or femoral bruits. Chest: symmetric with normal excursions and percussion. Breasts: Symmetric, without lumps, nipple discharge, retractions, or fibrocystic changes.  Abdomen: Flat, soft, with bowl sounds. Nontender, no guarding, rebound, hernias, masses, or organomegaly.  Lymphatics: Non tender without lymphadenopathy.  Genitourinary:  Musculoskeletal: Full ROM all peripheral extremities, joint stability, 5/5 strength, and normal gait. Skin: Warm and dry without rashes, lesions, cyanosis, clubbing or  ecchymosis.  Neuro: Cranial nerves intact, reflexes equal bilaterally. Normal muscle tone, no cerebellar symptoms. Sensation intact.  Pysch: Awake and oriented X 3, normal affect, Insight and Judgment appropriate.   Assessment and Plan  1. Annual Screening Examination 2. Hypertension  3. Hyperlipidemia 4. Pre Diabetes 5. Vitamin D  Deficiency  Continue prudent diet as discussed, weight control, BP monitoring, regular exercise, and medications. Discussed med's effects and SE's. Screening labs and tests as requested with regular follow-up as recommended.

## 2013-10-03 LAB — URINALYSIS, MICROSCOPIC ONLY
CASTS: NONE SEEN
CRYSTALS: NONE SEEN

## 2013-10-03 LAB — BASIC METABOLIC PANEL WITH GFR
BUN: 15 mg/dL (ref 6–23)
CHLORIDE: 102 meq/L (ref 96–112)
CO2: 26 meq/L (ref 19–32)
Calcium: 9.8 mg/dL (ref 8.4–10.5)
Creat: 0.84 mg/dL (ref 0.50–1.10)
GFR, Est African American: 81 mL/min
GFR, Est Non African American: 71 mL/min
Glucose, Bld: 109 mg/dL — ABNORMAL HIGH (ref 70–99)
Potassium: 4.5 mEq/L (ref 3.5–5.3)
Sodium: 141 mEq/L (ref 135–145)

## 2013-10-03 LAB — MICROALBUMIN / CREATININE URINE RATIO
Creatinine, Urine: 21.5 mg/dL
Microalb Creat Ratio: 23.3 mg/g (ref 0.0–30.0)
Microalb, Ur: 0.5 mg/dL (ref 0.00–1.89)

## 2013-10-03 LAB — TSH: TSH: 2.033 u[IU]/mL (ref 0.350–4.500)

## 2013-10-03 LAB — LIPID PANEL
Cholesterol: 149 mg/dL (ref 0–200)
HDL: 42 mg/dL (ref >39)
LDL CALC: 80 mg/dL (ref 0–99)
Total CHOL/HDL Ratio: 3.5 Ratio
Triglycerides: 135 mg/dL (ref <150)
VLDL: 27 mg/dL (ref 0–40)

## 2013-10-03 LAB — VITAMIN D 25 HYDROXY (VIT D DEFICIENCY, FRACTURES): VIT D 25 HYDROXY: 92 ng/mL — AB (ref 30–89)

## 2013-10-03 LAB — HEPATIC FUNCTION PANEL
ALBUMIN: 4.7 g/dL (ref 3.5–5.2)
ALK PHOS: 86 U/L (ref 39–117)
ALT: 29 U/L (ref 0–35)
AST: 23 U/L (ref 0–37)
Bilirubin, Direct: 0.2 mg/dL (ref 0.0–0.3)
Indirect Bilirubin: 0.6 mg/dL (ref 0.2–1.2)
TOTAL PROTEIN: 7.5 g/dL (ref 6.0–8.3)
Total Bilirubin: 0.8 mg/dL (ref 0.2–1.2)

## 2013-10-03 LAB — INSULIN, FASTING: Insulin fasting, serum: 42 u[IU]/mL — ABNORMAL HIGH (ref 3–28)

## 2013-10-03 LAB — MAGNESIUM: Magnesium: 1.7 mg/dL (ref 1.5–2.5)

## 2014-01-04 ENCOUNTER — Encounter: Payer: Self-pay | Admitting: Physician Assistant

## 2014-01-04 ENCOUNTER — Other Ambulatory Visit: Payer: Self-pay | Admitting: Internal Medicine

## 2014-01-04 ENCOUNTER — Ambulatory Visit (INDEPENDENT_AMBULATORY_CARE_PROVIDER_SITE_OTHER): Payer: Commercial Managed Care - HMO | Admitting: Physician Assistant

## 2014-01-04 VITALS — BP 132/72 | HR 76 | Temp 98.1°F | Resp 16 | Ht 63.0 in | Wt 205.0 lb

## 2014-01-04 DIAGNOSIS — E785 Hyperlipidemia, unspecified: Secondary | ICD-10-CM

## 2014-01-04 DIAGNOSIS — E559 Vitamin D deficiency, unspecified: Secondary | ICD-10-CM

## 2014-01-04 DIAGNOSIS — M545 Low back pain, unspecified: Secondary | ICD-10-CM

## 2014-01-04 DIAGNOSIS — E669 Obesity, unspecified: Secondary | ICD-10-CM

## 2014-01-04 DIAGNOSIS — R29898 Other symptoms and signs involving the musculoskeletal system: Secondary | ICD-10-CM

## 2014-01-04 DIAGNOSIS — R7309 Other abnormal glucose: Secondary | ICD-10-CM

## 2014-01-04 DIAGNOSIS — IMO0001 Reserved for inherently not codable concepts without codable children: Secondary | ICD-10-CM | POA: Insufficient documentation

## 2014-01-04 DIAGNOSIS — M810 Age-related osteoporosis without current pathological fracture: Secondary | ICD-10-CM

## 2014-01-04 DIAGNOSIS — Z79899 Other long term (current) drug therapy: Secondary | ICD-10-CM

## 2014-01-04 DIAGNOSIS — R7303 Prediabetes: Secondary | ICD-10-CM

## 2014-01-04 DIAGNOSIS — N182 Chronic kidney disease, stage 2 (mild): Secondary | ICD-10-CM

## 2014-01-04 DIAGNOSIS — Z23 Encounter for immunization: Secondary | ICD-10-CM

## 2014-01-04 DIAGNOSIS — K219 Gastro-esophageal reflux disease without esophagitis: Secondary | ICD-10-CM | POA: Insufficient documentation

## 2014-01-04 DIAGNOSIS — I10 Essential (primary) hypertension: Secondary | ICD-10-CM

## 2014-01-04 MED ORDER — ALPRAZOLAM 1 MG PO TABS: 1.0000 mg | ORAL_TABLET | Freq: Three times a day (TID) | ORAL | Status: DC | PRN

## 2014-01-04 NOTE — Progress Notes (Signed)
Assessment and Plan:  Hypertension: Continue medication, monitor blood pressure at home. Continue DASH diet. Cholesterol: Continue diet and exercise. Check cholesterol.  Pre-diabetes-Continue diet and exercise. Check A1C Vitamin D Def- check level and continue medications.  Myalgias-? From statin- check CPK stop for 1 week, check BMP, Mag Leg pain/weakness- normal reflexes- ? From spinal stenosis- with recent fall will send to PT for possible spinal stenosis/balance issues Obesity with co morbidities- long discussion about weight loss, diet, and exercise   Continue diet and meds as discussed. Further disposition pending results of labs. OVER 40 minutes of exam, counseling, chart review, referral performed   HPI 68 y.o. female  presents for 3 month follow up with hypertension, hyperlipidemia, prediabetes and vitamin D. Her blood pressure has been controlled at home, today their BP is BP: 132/72 mmHg She does not workout. She denies chest pain, shortness of breath, dizziness.  She is on cholesterol medication and denies myalgias. Her cholesterol is at goal. The cholesterol last visit was:   Lab Results  Component Value Date   CHOL 149 10/02/2013   HDL 42 10/02/2013   LDLCALC 80 10/02/2013   TRIG 135 10/02/2013   CHOLHDL 3.5 10/02/2013   She has been working on diet and exercise for prediabetes, and denies polydipsia, polyuria and visual disturbances. Last A1C in the office was:  Lab Results  Component Value Date   HGBA1C 6.3* 10/02/2013   Patient is on Vitamin D supplement.   Lab Results  Component Value Date   VD25OH 4* 10/02/2013     She has gone down to xanax twice daily but she has been under more stress with family/finances recently and likes the option of taking it 3 times occ, will send in 3 a day.  [place], [place] family here.  For the last 2-3 months she has been having bilateral leg pain, she is on lasix, magnesium BID and potassium 4 a  day. She is on 1 cholesterol. She fell Saturday, she states she was going down stairs with coffee in her hands, she feel with outstretched hands, no LOC. She also complains of lower back pain and a weakness/numbness in her legs better after walking or with bending over.   Current Medications:  Current Outpatient Prescriptions on File Prior to Visit  Medication Sig Dispense Refill  . alendronate (FOSAMAX) 70 MG tablet Take 1 tablet (70 mg total) by mouth once a week. Take with a full glass of water on an empty stomach.  4 tablet  3  . aspirin 81 MG tablet Take 81 mg by mouth daily.      Marland Kitchen atorvastatin (LIPITOR) 80 MG tablet TAKE 1 TABLET BY MOUTH EVERY DAY  90 tablet  1  . Cholecalciferol (VITAMIN D PO) Take 2,000 Units by mouth 2 (two) times daily.      Marland Kitchen esomeprazole (NEXIUM) 40 MG capsule Take 1 capsule (40 mg total) by mouth daily.  90 capsule  0  . FLUoxetine (PROZAC) 20 MG capsule TAKE 1 CAPSULE (20 MG TOTAL) BY MOUTH DAILY.  90 capsule  1  . furosemide (LASIX) 40 MG tablet TAKE 1 TABLET BY MOUTH TWICE A DAY  180 tablet  1  . GARLIC PO Take by mouth daily.      Marland Kitchen KLOR-CON M20 20 MEQ tablet TAKE 1 TABLET BY MOUTH 4 TIMES A DAY  360 tablet  1  . Magnesium Oxide 250 MG TABS Take by mouth.      Marland Kitchen  Omega-3 Fatty Acids (FISH OIL PO) Take by mouth 2 (two) times daily.       No current facility-administered medications on file prior to visit.   Medical History:  Past Medical History  Diagnosis Date  . Hypertension   . Hyperlipidemia   . GERD (gastroesophageal reflux disease)   . Depression   . Obesity   . Prediabetes   . Vitamin D deficiency    Allergies:  Allergies  Allergen Reactions  . Penicillins     Rash  . Wellbutrin [Bupropion]      Review of Systems:  = complains of   = denies  General: Fatigue  Fever  Chills  Weakness   Insomnia  Eyes: Redness  Blurred vision  Diplopia   ENT: Congestion  Sinus Pain  Post Nasal Drip  Sore Throat   Earache   Cardiac: Chest pain/pressure  SOB  Orthopnea   Palpitations   Paroxysmal nocturnal dyspnea[ ]  Claudication  Edema   Pulmonary: Cough  Wheezing[ ]   SOB   Snoring   GI: Nausea  Vomiting[ ]  Dysphagia[ ]  Heartburn[ ]  Abdominal pain  Constipation ; Diarrhea ; BRBPR  Melena[ ]  GU: Hematuria[ ]  Dysuria  Nocturia[ ]  Urgency   Hesitancy  Discharge  Neuro: Headaches[ ]  Vertigo[ ]  Paresthesias[ ]  Spasm  Speech changes  Incoordination   Ortho: Arthritis [x ] Joint pain [x ] Muscle pain [ x] Joint swelling  Back Pain [x ] Skin:  Rash   Pruritis  Change in skin lesion   Psych: Depression[ ]  Anxiety[ ]  Confusion  Memory loss   Heme/Lypmh: Bleeding  Bruising  Enlarged lymph nodes   Endocrine: Visual blurring  Paresthesia  Polyuria  Polydypsea    Heat/cold intolerance  Hypoglycemia   Family history- Review and unchanged Social history- Review and unchanged Physical Exam: BP 132/72  Pulse 76  Temp(Src) 98.1 F (36.7 C)  Resp 16  Ht  (1.6 m)  Wt 205 lb (92.987 kg)  BMI 36.32 kg/m2 Wt Readings from Last 3 Encounters:  01/04/14 205 lb (92.987 kg)  10/02/13 200 lb 9.6 oz (90.992 kg)  06/30/13 202 lb (91.627 kg)   General Appearance: Well nourished, in no apparent distress. Eyes: PERRLA, EOMs, conjunctiva no swelling or erythema Sinuses: No Frontal/maxillary tenderness ENT/Mouth: Ext aud canals clear, TMs without erythema, bulging. No erythema, swelling, or exudate on post pharynx.  Tonsils not swollen or erythematous. Hearing normal.  Neck: Supple, thyroid normal.  Respiratory: Respiratory effort normal, BS equal bilaterally without rales, rhonchi, wheezing or stridor.  Cardio: RRR with no MRGs. Brisk peripheral pulses without edema.  Abdomen: Soft, + BS.  Non tender, no guarding, rebound, hernias, masses. Lymphatics: Non tender without lymphadenopathy.  Musculoskeletal: Full  ROM, 5/5 strength, normal gait.  Skin: Warm, dry without rashes, lesions. She has several on left knee ecchymosis, and some on bilateral hands.  Neuro: Cranial nerves intact. Normal muscle tone, no cerebellar symptoms. Sensation intact.  Psych: Awake and oriented X 3, normal affect, Insight and Judgment appropriate.    [name] 3:40 PM

## 2014-01-04 NOTE — Patient Instructions (Addendum)
Stop the atrovastatin for 1 week and then restart at 1/2 a pill daily.   Find a physical therapist near you and we can fax a referral for you to start for your legs/back.     Bad carbs also include fruit juice, alcohol, and sweet tea. These are empty calories that do not signal to your brain that you are full.   Please remember the good carbs are still carbs which convert into sugar. So please measure them out no more than 1/2-1 cup of rice, oatmeal, pasta, and beans.  Veggies are however free foods! Pile them on.   I like lean protein at every meal such as chicken, [place], pork chops, cottage cheese, etc. Just do not fry these meats and please center your meal around vegetable, the meats should be a side dish.   No all fruit is created equal. Please see the list below, the fruit at the bottom is higher in sugars than the fruit at the top

## 2014-01-05 LAB — CBC WITH DIFFERENTIAL/PLATELET
Basophils Absolute: 0 10*3/uL (ref 0.0–0.1)
Basophils Relative: 0 % (ref 0–1)
EOS ABS: 0.2 10*3/uL (ref 0.0–0.7)
Eosinophils Relative: 3 % (ref 0–5)
HEMATOCRIT: 41 % (ref 36.0–46.0)
HEMOGLOBIN: 13.5 g/dL (ref 12.0–15.0)
Lymphocytes Relative: 47 % — ABNORMAL HIGH (ref 12–46)
Lymphs Abs: 3.4 10*3/uL (ref 0.7–4.0)
MCH: 29.4 pg (ref 26.0–34.0)
MCHC: 32.9 g/dL (ref 30.0–36.0)
MCV: 89.3 fL (ref 78.0–100.0)
MONO ABS: 0.4 10*3/uL (ref 0.1–1.0)
MONOS PCT: 6 % (ref 3–12)
Neutro Abs: 3.2 10*3/uL (ref 1.7–7.7)
Neutrophils Relative %: 44 % (ref 43–77)
Platelets: 208 10*3/uL (ref 150–400)
RBC: 4.59 MIL/uL (ref 3.87–5.11)
RDW: 13.5 % (ref 11.5–15.5)
WBC: 7.3 10*3/uL (ref 4.0–10.5)

## 2014-01-05 LAB — HEMOGLOBIN A1C
Hgb A1c MFr Bld: 6.2 % — ABNORMAL HIGH (ref <5.7)
Mean Plasma Glucose: 131 mg/dL — ABNORMAL HIGH (ref <117)

## 2014-01-05 LAB — LIPID PANEL
Cholesterol: 154 mg/dL (ref 0–200)
HDL: 42 mg/dL (ref >39)
LDL Cholesterol: 71 mg/dL (ref 0–99)
Total CHOL/HDL Ratio: 3.7 Ratio
Triglycerides: 203 mg/dL — ABNORMAL HIGH (ref <150)
VLDL: 41 mg/dL — ABNORMAL HIGH (ref 0–40)

## 2014-01-05 LAB — HEPATIC FUNCTION PANEL
ALBUMIN: 4.3 g/dL (ref 3.5–5.2)
ALK PHOS: 63 U/L (ref 39–117)
ALT: 31 U/L (ref 0–35)
AST: 26 U/L (ref 0–37)
BILIRUBIN DIRECT: 0.1 mg/dL (ref 0.0–0.3)
BILIRUBIN TOTAL: 0.5 mg/dL (ref 0.2–1.2)
Indirect Bilirubin: 0.4 mg/dL (ref 0.2–1.2)
Total Protein: 6.9 g/dL (ref 6.0–8.3)

## 2014-01-05 LAB — BASIC METABOLIC PANEL WITH GFR
BUN: 13 mg/dL (ref 6–23)
CO2: 25 mEq/L (ref 19–32)
Calcium: 9.3 mg/dL (ref 8.4–10.5)
Chloride: 109 mEq/L (ref 96–112)
Creat: 0.85 mg/dL (ref 0.50–1.10)
GFR, EST AFRICAN AMERICAN: 80 mL/min
GFR, Est Non African American: 70 mL/min
GLUCOSE: 123 mg/dL — AB (ref 70–99)
POTASSIUM: 4.4 meq/L (ref 3.5–5.3)
Sodium: 144 mEq/L (ref 135–145)

## 2014-01-05 LAB — INSULIN, FASTING: Insulin fasting, serum: 171.8 u[IU]/mL — ABNORMAL HIGH (ref 2.0–19.6)

## 2014-01-05 LAB — TSH: TSH: 1.841 u[IU]/mL (ref 0.350–4.500)

## 2014-01-05 LAB — VITAMIN D 25 HYDROXY (VIT D DEFICIENCY, FRACTURES): VIT D 25 HYDROXY: 72 ng/mL (ref 30–89)

## 2014-01-05 LAB — MAGNESIUM: MAGNESIUM: 2 mg/dL (ref 1.5–2.5)

## 2014-01-05 LAB — CK: Total CK: 210 U/L — ABNORMAL HIGH (ref 7–177)

## 2014-01-11 ENCOUNTER — Other Ambulatory Visit: Payer: Self-pay | Admitting: Emergency Medicine

## 2014-01-14 ENCOUNTER — Other Ambulatory Visit: Payer: Self-pay | Admitting: Physician Assistant

## 2014-01-14 DIAGNOSIS — R29898 Other symptoms and signs involving the musculoskeletal system: Secondary | ICD-10-CM

## 2014-01-14 DIAGNOSIS — M545 Low back pain: Secondary | ICD-10-CM

## 2014-01-30 ENCOUNTER — Other Ambulatory Visit: Payer: Self-pay | Admitting: Emergency Medicine

## 2014-01-31 ENCOUNTER — Other Ambulatory Visit: Payer: Self-pay | Admitting: Internal Medicine

## 2014-02-04 ENCOUNTER — Other Ambulatory Visit: Payer: Self-pay | Admitting: Emergency Medicine

## 2014-02-15 ENCOUNTER — Ambulatory Visit (INDEPENDENT_AMBULATORY_CARE_PROVIDER_SITE_OTHER): Payer: Commercial Managed Care - HMO | Admitting: *Deleted

## 2014-02-15 DIAGNOSIS — R899 Unspecified abnormal finding in specimens from other organs, systems and tissues: Secondary | ICD-10-CM

## 2014-02-15 DIAGNOSIS — M791 Myalgia, unspecified site: Secondary | ICD-10-CM

## 2014-02-15 DIAGNOSIS — E785 Hyperlipidemia, unspecified: Secondary | ICD-10-CM

## 2014-02-15 NOTE — Progress Notes (Signed)
Patient ID: [name], female   DOB: 02/11/1946, 68 y.o.   MRN: [id number] Patient presents for 1 month recheck labs. CPK was elevated at 12/2013 ov and was advised to stop Lipitor for 1 week and then restart at 1/2 the dose. Per Quentin MullingAmanda Collier, PA-C orders, cholesterol and CPK rechecked today.

## 2014-02-16 LAB — LIPID PANEL
Cholesterol: 155 mg/dL (ref 0–200)
HDL: 51 mg/dL (ref >39)
LDL CALC: 82 mg/dL (ref 0–99)
TRIGLYCERIDES: 112 mg/dL (ref <150)
Total CHOL/HDL Ratio: 3 Ratio
VLDL: 22 mg/dL (ref 0–40)

## 2014-02-16 LAB — CK: Total CK: 110 U/L (ref 7–177)

## 2014-02-18 ENCOUNTER — Telehealth: Payer: Self-pay

## 2014-02-18 NOTE — Telephone Encounter (Signed)
Return call to patient and gave lab results, advised her due to no return call she will also receive lab letter as well

## 2014-03-13 ENCOUNTER — Other Ambulatory Visit: Payer: Self-pay | Admitting: Physician Assistant

## 2014-04-03 ENCOUNTER — Other Ambulatory Visit: Payer: Self-pay | Admitting: Internal Medicine

## 2014-04-06 ENCOUNTER — Other Ambulatory Visit: Payer: Self-pay | Admitting: Physician Assistant

## 2014-04-06 ENCOUNTER — Ambulatory Visit (INDEPENDENT_AMBULATORY_CARE_PROVIDER_SITE_OTHER): Payer: Medicare PPO | Admitting: Physician Assistant

## 2014-04-06 ENCOUNTER — Ambulatory Visit: Payer: Self-pay | Admitting: Internal Medicine

## 2014-04-06 ENCOUNTER — Encounter: Payer: Self-pay | Admitting: Internal Medicine

## 2014-04-06 VITALS — BP 128/82 | HR 76 | Temp 97.9°F | Resp 16 | Ht 63.0 in | Wt 207.9 lb

## 2014-04-06 DIAGNOSIS — F32A Depression, unspecified: Secondary | ICD-10-CM

## 2014-04-06 DIAGNOSIS — Z9181 History of falling: Secondary | ICD-10-CM

## 2014-04-06 DIAGNOSIS — I10 Essential (primary) hypertension: Secondary | ICD-10-CM

## 2014-04-06 DIAGNOSIS — E669 Obesity, unspecified: Secondary | ICD-10-CM

## 2014-04-06 DIAGNOSIS — E785 Hyperlipidemia, unspecified: Secondary | ICD-10-CM

## 2014-04-06 DIAGNOSIS — IMO0001 Reserved for inherently not codable concepts without codable children: Secondary | ICD-10-CM

## 2014-04-06 DIAGNOSIS — R7303 Prediabetes: Secondary | ICD-10-CM

## 2014-04-06 DIAGNOSIS — F329 Major depressive disorder, single episode, unspecified: Secondary | ICD-10-CM

## 2014-04-06 DIAGNOSIS — R350 Frequency of micturition: Secondary | ICD-10-CM

## 2014-04-06 DIAGNOSIS — E559 Vitamin D deficiency, unspecified: Secondary | ICD-10-CM

## 2014-04-06 DIAGNOSIS — N182 Chronic kidney disease, stage 2 (mild): Secondary | ICD-10-CM

## 2014-04-06 DIAGNOSIS — R7309 Other abnormal glucose: Secondary | ICD-10-CM

## 2014-04-06 LAB — BASIC METABOLIC PANEL WITH GFR
BUN: 12 mg/dL (ref 6–23)
CALCIUM: 9.5 mg/dL (ref 8.4–10.5)
CO2: 27 mEq/L (ref 19–32)
CREATININE: 0.87 mg/dL (ref 0.50–1.10)
Chloride: 105 mEq/L (ref 96–112)
GFR, Est African American: 78 mL/min
GFR, Est Non African American: 68 mL/min
GLUCOSE: 78 mg/dL (ref 70–99)
Potassium: 4.3 mEq/L (ref 3.5–5.3)
Sodium: 140 mEq/L (ref 135–145)

## 2014-04-06 LAB — HEPATIC FUNCTION PANEL
ALK PHOS: 55 U/L (ref 39–117)
ALT: 47 U/L — AB (ref 0–35)
AST: 30 U/L (ref 0–37)
Albumin: 4.3 g/dL (ref 3.5–5.2)
Bilirubin, Direct: 0.1 mg/dL (ref 0.0–0.3)
Indirect Bilirubin: 0.4 mg/dL (ref 0.2–1.2)
TOTAL PROTEIN: 7.2 g/dL (ref 6.0–8.3)
Total Bilirubin: 0.5 mg/dL (ref 0.2–1.2)

## 2014-04-06 LAB — CBC WITH DIFFERENTIAL/PLATELET
Basophils Absolute: 0 10*3/uL (ref 0.0–0.1)
Basophils Relative: 0 % (ref 0–1)
EOS ABS: 0.1 10*3/uL (ref 0.0–0.7)
EOS PCT: 2 % (ref 0–5)
HEMATOCRIT: 41.8 % (ref 36.0–46.0)
HEMOGLOBIN: 13.8 g/dL (ref 12.0–15.0)
Lymphocytes Relative: 48 % — ABNORMAL HIGH (ref 12–46)
Lymphs Abs: 3.5 10*3/uL (ref 0.7–4.0)
MCH: 29.6 pg (ref 26.0–34.0)
MCHC: 33 g/dL (ref 30.0–36.0)
MCV: 89.7 fL (ref 78.0–100.0)
MONOS PCT: 6 % (ref 3–12)
MPV: 10.4 fL (ref 8.6–12.4)
Monocytes Absolute: 0.4 10*3/uL (ref 0.1–1.0)
Neutro Abs: 3.2 10*3/uL (ref 1.7–7.7)
Neutrophils Relative %: 44 % (ref 43–77)
Platelets: 206 10*3/uL (ref 150–400)
RBC: 4.66 MIL/uL (ref 3.87–5.11)
RDW: 14 % (ref 11.5–15.5)
WBC: 7.3 10*3/uL (ref 4.0–10.5)

## 2014-04-06 LAB — CK: Total CK: 63 U/L (ref 7–177)

## 2014-04-06 LAB — LIPID PANEL
CHOL/HDL RATIO: 3.9 ratio
Cholesterol: 164 mg/dL (ref 0–200)
HDL: 42 mg/dL (ref >39)
LDL Cholesterol: 79 mg/dL (ref 0–99)
TRIGLYCERIDES: 216 mg/dL — AB (ref <150)
VLDL: 43 mg/dL — ABNORMAL HIGH (ref 0–40)

## 2014-04-06 LAB — MAGNESIUM: Magnesium: 2 mg/dL (ref 1.5–2.5)

## 2014-04-06 MED ORDER — CLONAZEPAM 0.5 MG PO TABS
ORAL_TABLET | ORAL | Status: DC
Start: 2014-04-06 — End: 2014-05-05

## 2014-04-06 MED ORDER — FLUOXETINE HCL 40 MG PO CAPS: 40.0000 mg | ORAL_CAPSULE | Freq: Every day | ORAL | Status: DC

## 2014-04-06 MED ORDER — ATORVASTATIN CALCIUM 80 MG PO TABS: ORAL_TABLET | ORAL | Status: DC

## 2014-04-06 NOTE — Patient Instructions (Addendum)
Stop the xanax and can start the klonopin If this is not helping then I would say we need to increase the prozac to 40mg   Stop the lipitor for now  Muscle Pain Muscle pain (myalgia) may be caused by many things, including:  Overuse or muscle strain, especially if you are not in shape. This is the most common cause of muscle pain.  Injury.  Bruises.  Viruses, such as the flu.  Infectious diseases.  Fibromyalgia, which is a chronic condition that causes muscle tenderness, fatigue, and headache.  Autoimmune diseases, including lupus.  Certain drugs, including ACE inhibitors and statins. Muscle pain may be mild or severe. In most cases, the pain lasts only a short time and goes away without treatment. To diagnose the cause of your muscle pain, your health care provider will take your medical history. This means he or she will ask you when your muscle pain began and what has been happening. If you have not had muscle pain for very long, your health care provider may want to wait before doing much testing. If your muscle pain has lasted a long time, your health care provider may want to run tests right away. If your health care provider thinks your muscle pain may be caused by illness, you may need to have additional tests to rule out certain conditions.  Treatment for muscle pain depends on the cause. Home care is often enough to relieve muscle pain. Your health care provider may also prescribe anti-inflammatory medicine. HOME CARE INSTRUCTIONS Watch your condition for any changes. The following actions may help to lessen any discomfort you are feeling:  Only take over-the-counter or prescription medicines as directed by your health care provider.  Apply ice to the sore muscle:  Put ice in a plastic bag.  Place a towel between your skin and the bag.  Leave the ice on for 15-20 minutes, 3-4 times a day.  You may alternate applying hot and cold packs to the muscle as directed by your  health care provider.  If overuse is causing your muscle pain, slow down your activities until the pain goes away.  Remember that it is normal to feel some muscle pain after starting a workout program. Muscles that have not been used often will be sore at first.  Do regular, gentle exercises if you are not usually active.  Warm up before exercising to lower your risk of muscle pain.  Do not continue working out if the pain is very bad. Bad pain could mean you have injured a muscle. SEEK MEDICAL CARE IF:  Your muscle pain gets worse, and medicines do not help.  You have muscle pain that lasts longer than 3 days.  You have a rash or fever along with muscle pain.  You have muscle pain after a tick bite.  You have muscle pain while working out, even though you are in good physical condition.  You have redness, soreness, or swelling along with muscle pain.  You have muscle pain after starting a new medicine or changing the dose of a medicine. SEEK IMMEDIATE MEDICAL CARE IF:  You have trouble breathing.  You have trouble swallowing.  You have muscle pain along with a stiff neck, fever, and vomiting.  You have severe muscle weakness or cannot move part of your body. MAKE SURE YOU:   Understand these instructions.  Will watch your condition.  Will get help right away if you are not doing well or get worse. Document Released: 02/15/2006 Document  Revised: 03/31/2013 Document Reviewed: 01/20/2013 [facility] Patient Information 2015 [employer], [place]. This information is not intended to replace advice given to you by your health care provider. Make sure you discuss any questions you have with your health care provider.  Spinal Stenosis Spinal stenosis is an abnormal narrowing of the canals of your spine (vertebrae). CAUSES  Spinal stenosis is caused by areas of bone pushing into the central canals of your vertebrae. This condition can be present at birth (congenital). It also may be  caused by arthritic deterioration of your vertebrae (spinal degeneration).  SYMPTOMS   Pain that is generally worse with activities, particularly standing and walking.  Numbness, tingling, hot or cold sensations, weakness, or weariness in your legs.  Frequent episodes of falling.  A foot-slapping gait that leads to muscle weakness. DIAGNOSIS  Spinal stenosis is diagnosed with the use of magnetic resonance imaging (MRI) or computed tomography (CT). TREATMENT  Initial therapy for spinal stenosis focuses on the management of the pain and other symptoms associated with the condition. These therapies include:  Practicing postural changes to lessen pressure on your nerves.  Exercises to strengthen the core of your body.  Loss of excess body weight.  The use of nonsteroidal anti-inflammatory medicines to reduce swelling and inflammation in your nerves. When therapies to manage pain are not successful, surgery to treat spinal stenosis may be recommended. This surgery involves removing excess bone, which puts pressure on your nerve roots. During this surgery (laminectomy), the posterior boney arch (lamina) and excess bone around the facet joints are removed. Document Released: 06/16/2003 Document Revised: 08/10/2013 Document Reviewed: 07/04/2012 [facility] Patient Information 2015 [place]. This information is not intended to replace advice given to you by your health care provider. Make sure you discuss any questions you have with your health care provider.

## 2014-04-06 NOTE — Progress Notes (Signed)
Assessment and Plan:  Hypertension: Continue medication, monitor blood pressure at home. Continue DASH diet.  Reminder to go to the ER if any CP, SOB, nausea, dizziness, severe HA, changes vision/speech, left arm numbness and tingling, and jaw pain. Cholesterol: Continue diet and exercise. Check cholesterol.  Pre-diabetes-Continue diet and exercise. Check A1C Vitamin D Def- check level and continue medications.  Myalgias- stop statin, take NSAIDS PRN, increase fluids, will check CPK, aldolase, TSH, ESR, magnesium, potassium Obesity with co morbidities- long discussion about weight loss, diet, and exercise Depression/anxiety- continue medications except we will stop the xanax and try her on klonopin for stress/RLS, stress management techniques discussed, increase water, good sleep hygiene discussed, increase exercise, and increase veggies.  High risk falls-check labs, ? Benefit from PT due to spinal stenosis/falls   Continue diet and meds as discussed. Further disposition pending results of labs. OVER 40 minutes of exam, counseling, chart review, referral performed  HPI 68 y.o. female  presents for 3 month follow up with hypertension, hyperlipidemia, prediabetes and vitamin D. Her blood pressure has been controlled at home, today their BP is BP: 128/82 mmHg She does not workout. She denies chest pain, shortness of breath, dizziness.  She is on cholesterol medication, her CPK was elevated and her cholesterol was decreased to 1/2 pill, CPK decreased but she continues to complain of myalgias. Her cholesterol is at goal. The cholesterol last visit was:   Lab Results  Component Value Date   CHOL 155 02/15/2014   HDL 51 02/15/2014   LDLCALC 82 02/15/2014   TRIG 112 02/15/2014   CHOLHDL 3.0 02/15/2014   She has been working on diet and exercise for prediabetes, and denies paresthesia of the feet, polydipsia, polyuria and visual disturbances. Last A1C in the office was:  Lab Results  Component  Value Date   HGBA1C 6.2* 01/04/2014   Patient is on Vitamin D supplement.   Lab Results  Component Value Date   VD25OH 72 01/04/2014     She complains of increasing myalgias, states she did fall off the porch recently and it "jolted" her and has had increasing pain since that time, she has had several falls over the past several months. She has pain all over. She is still on lasix and is on only 1 pill a day of mag and potassium.  She states she has been very depressed, she is on xanax 78m twice a day, she went up to three a day due to stress but states that she felt like a zombie but would like to try klonopin instead.  She states that she also has had some depression around the holidays.  BMI is Body mass index is 36.84 kg/(m^2)., she is working on diet and exercise. Wt Readings from Last 3 Encounters:  04/06/14 207 lb 14.4 oz (94.303 kg)  01/04/14 205 lb (92.987 kg)  10/02/13 200 lb 9.6 oz (90.992 kg)    Current Medications:  Current Outpatient Prescriptions on File Prior to Visit  Medication Sig Dispense Refill  . alendronate (FOSAMAX) 70 MG tablet TAKE 1 TABLET BY MOUTH ONCE WEEKLY **TAKE FULL GLASS OF WATER ON AN EMPTY STOMACH 4 tablet 3  . alendronate (FOSAMAX) 70 MG tablet TAKE 1 TABLET BY MOUTH ONCE WEEKLY **TAKE FULL GLASS OF WATER ON AN EMPTY STOMACH 4 tablet 3  . ALPRAZolam (XANAX) 1 MG tablet TAKE 1 TABLET 3 TIMES A DAY AS NEEDED 90 tablet 2  . aspirin 81 MG tablet Take 81 mg by mouth daily.    .Marland Kitchen  atorvastatin (LIPITOR) 80 MG tablet TAKE 1 TABLET BY MOUTH EVERY DAY 90 tablet 1  . Cholecalciferol (VITAMIN D PO) Take 2,000 Units by mouth 2 (two) times daily.    Marland Kitchen esomeprazole (NEXIUM) 40 MG capsule Take 1 capsule (40 mg total) by mouth daily. 90 capsule 0  . FLUoxetine (PROZAC) 20 MG capsule TAKE ONE CAPSULE BY MOUTH EVERY DAY 30 capsule 0  . furosemide (LASIX) 40 MG tablet TAKE 1 TABLET BY MOUTH TWICE A DAY 180 tablet 1  . GARLIC PO Take by mouth daily.    Marland Kitchen KLOR-CON M20 20  MEQ tablet TAKE 1 TABLET BY MOUTH 4 TIMES A DAY 360 tablet 1  . Magnesium Oxide 250 MG TABS Take by mouth.    . Omega-3 Fatty Acids (FISH OIL PO) Take by mouth 2 (two) times daily.     No current facility-administered medications on file prior to visit.   Medical History:  Past Medical History  Diagnosis Date  . Hypertension   . Hyperlipidemia   . GERD (gastroesophageal reflux disease)   . Depression   . Obesity   . Prediabetes   . Vitamin D deficiency    Allergies:  Allergies  Allergen Reactions  . Penicillins     Rash  . Wellbutrin [Bupropion]     Review of Systems:  Review of Systems  Constitutional: Positive for malaise/fatigue. Negative for fever, chills, weight loss and diaphoresis.  HENT: Negative.   Eyes: Negative.   Respiratory: Negative.   Cardiovascular: Negative.   Gastrointestinal: Positive for constipation. Negative for heartburn, nausea, vomiting, abdominal pain, diarrhea, blood in stool and melena.  Genitourinary: Positive for frequency. Negative for dysuria, urgency, hematuria and flank pain.  Musculoskeletal: Positive for myalgias, back pain and falls. Negative for joint pain and neck pain.  Skin: Negative.   Neurological: Positive for tingling (bilateral legs). Negative for dizziness, tremors, sensory change, speech change, focal weakness, seizures, loss of consciousness and weakness.  Endo/Heme/Allergies: Negative.  Does not bruise/bleed easily.  Psychiatric/Behavioral: Positive for depression. Negative for suicidal ideas, hallucinations, memory loss and substance abuse. The patient has insomnia. The patient is not nervous/anxious.      Family history- Review and unchanged Social history- Review and unchanged Physical Exam: BP 128/82 mmHg  Pulse 76  Temp(Src) 97.9 F (36.6 C)  Resp 16  Ht '5\' 3"'  (1.6 m)  Wt 207 lb 14.4 oz (94.303 kg)  BMI 36.84 kg/m2 Wt Readings from Last 3 Encounters:  04/06/14 207 lb 14.4 oz (94.303 kg)  01/04/14 205 lb  (92.987 kg)  10/02/13 200 lb 9.6 oz (90.992 kg)   General Appearance: Well nourished, in no apparent distress. Eyes: PERRLA, EOMs, conjunctiva no swelling or erythema Sinuses: No Frontal/maxillary tenderness ENT/Mouth: Ext aud canals clear, TMs without erythema, bulging. No erythema, swelling, or exudate on post pharynx.  Tonsils not swollen or erythematous. Hearing normal.  Neck: Supple, thyroid normal.  Respiratory: Respiratory effort normal, BS equal bilaterally without rales, rhonchi, wheezing or stridor.  Cardio: RRR with no MRGs. Brisk peripheral pulses without edema.  Abdomen: Soft, + BS, obese Non tender, no guarding, rebound, hernias, masses. Lymphatics: Non tender without lymphadenopathy.  Musculoskeletal: Full ROM, 4/5 strength, normal gait.  Skin: Warm, dry without rashes, lesions, ecchymosis.  Neuro: Cranial nerves intact. Normal muscle tone, no cerebellar symptoms. Sensation intact.  Psych: Awake and oriented X 3, normal affect, Insight and Judgment appropriate.    Vicie Mutters, PA-C 12:58 PM [facility]

## 2014-04-07 LAB — VITAMIN D 25 HYDROXY (VIT D DEFICIENCY, FRACTURES): Vit D, 25-Hydroxy: 61 ng/mL (ref 30–100)

## 2014-04-07 LAB — URINALYSIS, ROUTINE W REFLEX MICROSCOPIC
BILIRUBIN URINE: NEGATIVE
Glucose, UA: NEGATIVE mg/dL
Hgb urine dipstick: NEGATIVE
Ketones, ur: NEGATIVE mg/dL
Leukocytes, UA: NEGATIVE
Nitrite: NEGATIVE
Protein, ur: NEGATIVE mg/dL
SPECIFIC GRAVITY, URINE: 1.009 (ref 1.005–1.030)
Urobilinogen, UA: 0.2 mg/dL (ref 0.0–1.0)
pH: 6 (ref 5.0–8.0)

## 2014-04-07 LAB — SEDIMENTATION RATE: Sed Rate: 8 mm/hr (ref 0–22)

## 2014-04-07 LAB — HEMOGLOBIN A1C
Hgb A1c MFr Bld: 6.1 % — ABNORMAL HIGH (ref <5.7)
Mean Plasma Glucose: 128 mg/dL — ABNORMAL HIGH (ref <117)

## 2014-04-07 LAB — TSH: TSH: 1.92 u[IU]/mL (ref 0.350–4.500)

## 2014-04-08 ENCOUNTER — Other Ambulatory Visit: Payer: Self-pay

## 2014-04-08 LAB — URINE CULTURE

## 2014-04-08 LAB — ALDOLASE: ALDOLASE: 6 U/L (ref <=8.1)

## 2014-04-08 LAB — VITAMIN B12

## 2014-04-08 MED ORDER — ESOMEPRAZOLE MAGNESIUM 40 MG PO CPDR: 40.0000 mg | DELAYED_RELEASE_CAPSULE | Freq: Every day | ORAL | Status: AC

## 2014-04-12 ENCOUNTER — Other Ambulatory Visit: Payer: Self-pay | Admitting: Physician Assistant

## 2014-04-21 ENCOUNTER — Ambulatory Visit: Payer: Self-pay | Admitting: Internal Medicine

## 2014-05-05 ENCOUNTER — Telehealth: Payer: Self-pay

## 2014-05-05 MED ORDER — CLONAZEPAM 2 MG PO TABS: 2.0000 mg | ORAL_TABLET | Freq: Every day | ORAL | Status: DC

## 2014-05-05 NOTE — Telephone Encounter (Signed)
Received paper note from front office staff and returned call to patient regarding her Klonopin 0.5 mg 1-2 at bedtime as needed for sleep, patient states she is still unable to sleep on this dose, per Quentin MullingAmanda Collier, PA, I offered patient Klonopin 2 mg one at bedtime, Valium 5 mg 1/2 to 1 at bedtime or to come in and discuss with Marchelle FolksAmanda, patient asked that I call in the 2 mg dose and she will try that before office visit.

## 2014-05-16 ENCOUNTER — Other Ambulatory Visit: Payer: Self-pay | Admitting: Physician Assistant

## 2014-05-29 ENCOUNTER — Other Ambulatory Visit: Payer: Self-pay | Admitting: Internal Medicine

## 2014-06-03 ENCOUNTER — Other Ambulatory Visit: Payer: Self-pay | Admitting: Physician Assistant

## 2014-06-09 ENCOUNTER — Other Ambulatory Visit: Payer: Self-pay | Admitting: *Deleted

## 2014-06-09 MED ORDER — FLUOXETINE HCL 20 MG PO CAPS: 20.0000 mg | ORAL_CAPSULE | Freq: Every day | ORAL | Status: DC

## 2014-06-09 MED ORDER — CLONAZEPAM 2 MG PO TABS: 2.0000 mg | ORAL_TABLET | Freq: Every day | ORAL | Status: DC

## 2014-07-09 ENCOUNTER — Ambulatory Visit (INDEPENDENT_AMBULATORY_CARE_PROVIDER_SITE_OTHER): Payer: Medicare PPO | Admitting: Internal Medicine

## 2014-07-09 ENCOUNTER — Encounter: Payer: Self-pay | Admitting: Internal Medicine

## 2014-07-09 VITALS — BP 132/62 | HR 72 | Temp 97.3°F | Resp 16 | Ht 63.5 in | Wt 212.2 lb

## 2014-07-09 DIAGNOSIS — Z0001 Encounter for general adult medical examination with abnormal findings: Secondary | ICD-10-CM

## 2014-07-09 DIAGNOSIS — R6889 Other general symptoms and signs: Secondary | ICD-10-CM

## 2014-07-09 DIAGNOSIS — M810 Age-related osteoporosis without current pathological fracture: Secondary | ICD-10-CM

## 2014-07-09 DIAGNOSIS — F32A Depression, unspecified: Secondary | ICD-10-CM

## 2014-07-09 DIAGNOSIS — Z9181 History of falling: Secondary | ICD-10-CM

## 2014-07-09 DIAGNOSIS — Z79899 Other long term (current) drug therapy: Secondary | ICD-10-CM | POA: Insufficient documentation

## 2014-07-09 DIAGNOSIS — F419 Anxiety disorder, unspecified: Secondary | ICD-10-CM | POA: Insufficient documentation

## 2014-07-09 DIAGNOSIS — K2100 Gastro-esophageal reflux disease with esophagitis, without bleeding: Secondary | ICD-10-CM

## 2014-07-09 DIAGNOSIS — Z0000 Encounter for general adult medical examination without abnormal findings: Secondary | ICD-10-CM

## 2014-07-09 DIAGNOSIS — F329 Major depressive disorder, single episode, unspecified: Secondary | ICD-10-CM

## 2014-07-09 DIAGNOSIS — E785 Hyperlipidemia, unspecified: Secondary | ICD-10-CM

## 2014-07-09 DIAGNOSIS — R7303 Prediabetes: Secondary | ICD-10-CM

## 2014-07-09 DIAGNOSIS — E559 Vitamin D deficiency, unspecified: Secondary | ICD-10-CM

## 2014-07-09 DIAGNOSIS — Z1331 Encounter for screening for depression: Secondary | ICD-10-CM

## 2014-07-09 DIAGNOSIS — I10 Essential (primary) hypertension: Secondary | ICD-10-CM

## 2014-07-09 DIAGNOSIS — N182 Chronic kidney disease, stage 2 (mild): Secondary | ICD-10-CM

## 2014-07-09 LAB — BASIC METABOLIC PANEL WITH GFR
BUN: 13 mg/dL (ref 6–23)
CHLORIDE: 104 meq/L (ref 96–112)
CO2: 25 mEq/L (ref 19–32)
CREATININE: 0.9 mg/dL (ref 0.50–1.10)
Calcium: 9.4 mg/dL (ref 8.4–10.5)
GFR, EST NON AFRICAN AMERICAN: 65 mL/min
GFR, Est African American: 74 mL/min
Glucose, Bld: 123 mg/dL — ABNORMAL HIGH (ref 70–99)
Potassium: 4.2 mEq/L (ref 3.5–5.3)
Sodium: 142 mEq/L (ref 135–145)

## 2014-07-09 LAB — LIPID PANEL
CHOLESTEROL: 163 mg/dL (ref 0–200)
HDL: 40 mg/dL — AB (ref >=46)
LDL CALC: 83 mg/dL (ref 0–99)
TRIGLYCERIDES: 199 mg/dL — AB (ref <150)
Total CHOL/HDL Ratio: 4.1 Ratio
VLDL: 40 mg/dL (ref 0–40)

## 2014-07-09 LAB — CBC WITH DIFFERENTIAL/PLATELET
BASOS PCT: 0 % (ref 0–1)
Basophils Absolute: 0 10*3/uL (ref 0.0–0.1)
Eosinophils Absolute: 0.1 10*3/uL (ref 0.0–0.7)
Eosinophils Relative: 1 % (ref 0–5)
HEMATOCRIT: 41.9 % (ref 36.0–46.0)
Hemoglobin: 14 g/dL (ref 12.0–15.0)
Lymphocytes Relative: 45 % (ref 12–46)
Lymphs Abs: 3.5 10*3/uL (ref 0.7–4.0)
MCH: 30 pg (ref 26.0–34.0)
MCHC: 33.4 g/dL (ref 30.0–36.0)
MCV: 89.7 fL (ref 78.0–100.0)
MONO ABS: 0.5 10*3/uL (ref 0.1–1.0)
MPV: 10.6 fL (ref 8.6–12.4)
Monocytes Relative: 6 % (ref 3–12)
NEUTROS ABS: 3.7 10*3/uL (ref 1.7–7.7)
Neutrophils Relative %: 48 % (ref 43–77)
PLATELETS: 212 10*3/uL (ref 150–400)
RBC: 4.67 MIL/uL (ref 3.87–5.11)
RDW: 13.3 % (ref 11.5–15.5)
WBC: 7.7 10*3/uL (ref 4.0–10.5)

## 2014-07-09 LAB — HEPATIC FUNCTION PANEL
ALK PHOS: 56 U/L (ref 39–117)
ALT: 33 U/L (ref 0–35)
AST: 22 U/L (ref 0–37)
Albumin: 4.4 g/dL (ref 3.5–5.2)
BILIRUBIN DIRECT: 0.1 mg/dL (ref 0.0–0.3)
Indirect Bilirubin: 0.6 mg/dL (ref 0.2–1.2)
TOTAL PROTEIN: 7.4 g/dL (ref 6.0–8.3)
Total Bilirubin: 0.7 mg/dL (ref 0.2–1.2)

## 2014-07-09 LAB — MAGNESIUM: MAGNESIUM: 1.8 mg/dL (ref 1.5–2.5)

## 2014-07-09 MED ORDER — PHENTERMINE HCL 37.5 MG PO TABS: ORAL_TABLET | ORAL | Status: AC

## 2014-07-09 MED ORDER — CLONAZEPAM 2 MG PO TABS: ORAL_TABLET | ORAL | Status: DC

## 2014-07-09 NOTE — Progress Notes (Signed)
Patient ID: [name], female   DOB: [birth year], 69 y.o.   MRN: [id number]  MEDICARE ANNUAL WELLNESS VISIT AND OV  Assessment:   1. Essential hypertension  - TSH  2. Hyperlipidemia  - Lipid panel  3. Prediabetes  - Hemoglobin A1c - Insulin, random  4. Vitamin D deficiency  - Vit D  25 hydroxy (rtn osteoporosis monitoring)  5. Gastroesophageal reflux disease with esophagitis   6. CKD (chronic kidney disease) stage 2, GFR 60-89 ml/min   7. Morbid obesity (BMI 37)    8. Osteoporosis   9. Depression screen   10. Depression, controlled   11. At low risk for fall   12. Medication management  - CBC with Differential/Platelet - BASIC METABOLIC PANEL WITH GFR - Hepatic function panel - Magnesium  13. Routine general medical examination at a health care facility  Plan:   During the course of the visit the patient was educated and counseled about appropriate screening and preventive services including:    Pneumococcal vaccine   Influenza vaccine  Td vaccine  Screening electrocardiogram  Bone densitometry screening  Colorectal cancer screening  Diabetes screening  Glaucoma screening  Nutrition counseling   Advanced directives: requested  Screening recommendations, referrals: Vaccinations:  Immunization History  Administered Date(s) Administered  . Influenza, High Dose Seasonal PF 01/04/2014  . Influenza-Unspecified 12/29/2012  . Pneumococcal-Unspecified 07/21/2008  . Td 04/27/2005  Prevnar vaccine ordered Shingles vaccine undecided Hep B vaccine not indicated  Nutrition assessed and recommended  Colonoscopy declined Recommended yearly ophthalmology/optometry visit for glaucoma screening and checkup Recommended yearly dental visit for hygiene and checkup Advanced directives - No - Offered forms  Conditions/risks identified: BMI: Discussed weight loss, diet, and increase physical activity.  Increase physical activity: AHA  recommends 150 minutes of physical activity a week.  Medications reviewed PreDiabetes is not at goal due to Overeating, ACE/ARB therapy: Not Indicated Urinary Incontinence is not an issue: discussed non pharmacology and pharmacology options.  Fall risk: low- discussed PT, home fall assessment, medications.   Subjective:    [name] presents for [employer] Visit and complete physical.  Date of last medicare wellness visit was 06/30/2013.  This very nice 69 y.o. WWF presents for 3 month follow up with Hypertension, Hyperlipidemia, Pre-Diabetes and Vitamin D Deficiency.    Patient is treated for HTN since 1995 & BP has been controlled at home. Today's BP: 132/62 mmHg. Patient has had no complaints of any cardiac type chest pain, palpitations, dyspnea/orthopnea/PND, dizziness, claudication, or dependent edema. Patient does not exercise formally.   Hyperlipidemia is controlled with diet & meds. Patient denies myalgias or other med SE's. Last Lipids were at goal - Total Chol 164; HDL 42; LDL 79; Trig 216 on 04/06/2014.   Also, the patient has history of Morbid Obesity (BMI 37) and is not dietary compliant and consequently has PreDiabetes and has had no symptoms of reactive hypoglycemia, diabetic polys, paresthesias or visual blurring.  Last A1c was 6.1% on 04/06/2014.   Further, the patient also has history of Vitamin D Deficiency of 13 in 2008 and supplements vitamin D without any suspected side-effects. Last vitamin D was 61 on 04/06/2014.  Names of Other Physician/Practitioners you currently use: 1. [facility] here for primary care 2. Dr Hazle Quant, eye doctor, last visit - Jan 2016 3. Dr Donovan Kail, dentist, last visit 2015  Patient Care Team: Lucky Cowboy, MD as PCP - General (Internal Medicine) Amy Sw[place]il[place], MD as Consulting Physician (  Dermatology) Nelson Chimes, MD as Consulting Physician (Ophthalmology)  Medication  Review: Medication Sig  . alendronate (FOSAMAX) 70 MG tablet TAKE 1 TABLET BY MOUTH ONCE WEEKLY  . aspirin 81 MG tablet Take 81 mg by mouth daily.  Marland Kitchen atorvastatin (LIPITOR) 80 MG tablet 1/2-1 pill nightly for cholesterol  . Cholecalciferol (VITAMIN D PO) Take 2,000 Units by mouth 2 (two) times daily.  . clonazePAM (KLONOPIN) 2 MG tablet Take 1 tablet (2 mg total) by mouth at bedtime.  Marland Kitchen esomeprazole (NEXIUM) 40 MG capsule Take 1 capsule (40 mg total) by mouth daily.  Marland Kitchen FLUoxetine (PROZAC) 20 MG capsule Take 1 capsule (20 mg total) by mouth daily.  . furosemide (LASIX) 40 MG tablet TAKE 1 TABLET BY MOUTH TWICE A DAY  . KLOR-CON M20 20 MEQ tablet TAKE 1 TABLET BY MOUTH 4 TIMES A DAY  . Magnesium Oxide 250 MG TABS Take by mouth.   Current Problems (verified) Patient Active Problem List   Diagnosis Date Noted  . Medication management 07/09/2014  . Osteoporosis 01/04/2014  . GERD (gastroesophageal reflux disease) 01/04/2014  . CKD (chronic kidney disease) stage 2, GFR 60-89 ml/min 01/04/2014  . Fibromyalgia 01/04/2014  . Hypertension   . Hyperlipidemia   . Obesity   . Prediabetes   . Vitamin D deficiency    Screening Tests Health Maintenance  Topic Date Due  . COLONOSCOPY  05/12/1993  . ZOSTAVAX  05/13/2003  . PNA vac Low Risk Adult (2 of 2 - PCV13) 07/21/2009  . INFLUENZA VACCINE  11/08/2014  . TETANUS/TDAP  04/28/2015  . MAMMOGRAM  08/27/2015  . DEXA SCAN  Completed   Immunization History  Administered Date(s) Administered  . Influenza, High Dose Seasonal PF 01/04/2014  . Influenza-Unspecified 12/29/2012  . Pneumococcal-Unspecified 07/21/2008  . Td 04/27/2005   Preventative care: Last colonoscopy: declined , but did agree to do Cologard if Insurance covers  History reviewed: allergies, current medications, past family history, past medical history, past social history, past surgical history and problem list  Risk Factors: Tobacco History  Substance Use Topics  .  Smoking status: Former Smoker    Quit date: 07/01/1998  . Smokeless tobacco: Never Used  . Alcohol Use: No   She does not smoke.  Patient is a former smoker. Are there smokers in your home (other than you)?  No Alcohol Current alcohol use: none  Caffeine Current caffeine use: denies use  Exercise Current exercise: housecleaning, walking and yard work  Nutrition/Diet Current diet: in general, an "unhealthy" diet  Cardiac risk factors: advanced age (older than 73 for men, 74 for women), dyslipidemia, hypertension, obesity (BMI >= 30 kg/m2), sedentary lifestyle and smoking/ tobacco exposure.  Depression Screen (Note: if answer to either of the following is "Yes", a more complete depression screening is indicated)   Q1: Over the past two weeks, have you felt down, depressed or hopeless? No  Q2: Over the past two weeks, have you felt little interest or pleasure in doing things? No  Have you lost interest or pleasure in daily life? No  Do you often feel hopeless? No  Do you cry easily over simple problems? No  Activities of Daily Living In your present state of health, do you have any difficulty performing the following activities?:  Driving? No Managing money?  No Feeding yourself? No Getting from bed to chair? No Climbing a flight of stairs? No Preparing food and eating?: No Bathing or showering? No Getting dressed: No Getting to the toilet? No Using  the toilet:No Moving around from place to place: No In the past year have you fallen or had a near fall?:No   Are you sexually active?  Yes  Do you have more than one partner?  No  Vision Difficulties: No  Hearing Difficulties: No Do you often ask people to speak up or repeat themselves? No Do you experience ringing or noises in your ears? No Do you have difficulty understanding soft or whispered voices? Sometimes.  Cognition  Do you feel that you have a problem with memory?No  Do you often misplace items? No  Do you  feel safe at home?  Yes  Advanced directives Does patient have a Health Care Power of Attorney? No - offered forms Does patient have a Living Will? No - offered forms  Past Medical History  Diagnosis Date  . Hypertension   . Hyperlipidemia   . GERD (gastroesophageal reflux disease)   . Depression   . Obesity   . Prediabetes   . Vitamin D deficiency    Past Surgical History  Procedure Laterality Date  . Skin cancer excision    . Carpal tunnel release Bilateral   . Abdominal hysterectomy    . Lasik    . Pilonidal cyst excision  1978   ROS: Constitutional: Denies fever, chills, weight loss/gain, headaches, insomnia, fatigue, night sweats, and change in appetite. Eyes: Denies redness, blurred vision, diplopia, discharge, itchy, watery eyes.  ENT: Denies discharge, congestion, post nasal drip, epistaxis, sore throat, earache, hearing loss, dental pain, Tinnitus, Vertigo, Sinus pain, snoring.  Cardio: Denies chest pain, palpitations, irregular heartbeat, syncope, dyspnea, diaphoresis, orthopnea, PND, claudication, edema Respiratory: denies cough, dyspnea, DOE, pleurisy, hoarseness, laryngitis, wheezing.  Gastrointestinal: Denies dysphagia, heartburn, reflux, water brash, pain, cramps, nausea, vomiting, bloating, diarrhea, constipation, hematemesis, melena, hematochezia, jaundice, hemorrhoids Genitourinary: Denies dysuria, frequency, urgency, nocturia, hesitancy, discharge, hematuria, flank pain Breast: Breast lumps, nipple discharge, bleeding.  Musculoskeletal: Denies arthralgia, myalgia, stiffness, Jt. Swelling, pain, limp, and strain/sprain. Denies falls. Skin: Denies puritis, rash, hives, warts, acne, eczema, changing in skin lesion Neuro: No weakness, tremor, incoordination, spasms, paresthesia, pain Psychiatric: Denies confusion, memory loss, sensory loss. Denies Depression. Endocrine: Denies change in weight, skin, hair change, nocturia, and paresthesia, diabetic polys, visual  blurring, hyper / hypo glycemic episodes.  Heme/Lymph: No excessive bleeding, bruising, enlarged lymph nodes  Objective:     BP 132/62   Pulse 72  Temp 97.3 F   Resp 16  Ht 5' 3.5"   Wt 212 lb 3.2 oz     BMI 37.00  General Appearance: Well nourished, alert, WD/WN, female and in no apparent distress. Eyes: PERRLA, EOMs, conjunctiva no swelling or erythema, normal fundi and vessels. Sinuses: No frontal/maxillary tenderness ENT/Mouth: EACs patent / TMs  nl. Nares clear without erythema, swelling, mucoid exudates. Oral hygiene is good. No erythema, swelling, or exudate. Tongue normal, non-obstructing. Tonsils not swollen or erythematous. Hearing normal.  Neck: Supple, thyroid normal. No bruits, nodes or JVD. Respiratory: Respiratory effort normal.  BS equal and clear bilateral without rales, rhonci, wheezing or stridor. Cardio: Heart sounds are normal with regular rate and rhythm and no murmurs, rubs or gallops. Peripheral pulses are normal and equal bilaterally without edema. No aortic or femoral bruits. Chest: symmetric with normal excursions and percussion. Breasts: Symmetric, without lumps, nipple discharge, retractions, or fibrocystic changes.  Abdomen: Flat, soft  with nl bowel sounds. Nontender, no guarding, rebound, hernias, masses, or organomegaly.  Lymphatics: Non tender without lymphadenopathy.  Genitourinary:  Musculoskeletal: Full ROM  all peripheral extremities, joint stability, 5/5 strength, and normal gait. Skin: Warm and dry without rashes, lesions, cyanosis, clubbing or  ecchymosis.  Neuro: Cranial nerves intact, reflexes equal bilaterally. Normal muscle tone, no cerebellar symptoms. Sensation intact.  Pysch: Awake and oriented X 3, normal affect, Insight and Judgment appropriate.   Cognitive Testing  Alert? Yes  Normal Appearance?Yes  Oriented to person? Yes  Place? Yes   Time? Yes  Recall of three objects?  Yes  Can perform simple calculations? Yes  Displays  appropriate judgment? Yes  Can read the correct time from a watch/clock?Yes  Medicare Attestation I have personally reviewed: The patient's medical and social history Their use of alcohol, tobacco or illicit drugs Their current medications and supplements The patient's functional ability including ADLs,fall risks, home safety risks, cognitive, and hearing and visual impairment Diet and physical activities Evidence for depression or mood disorders  The patient's weight, height, BMI, and visual acuity have been recorded in the chart.  I have made referrals, counseling, and provided education to the patient based on review of the above and I have provided the patient with a written personalized care plan for preventive services.  Over 40 minutes of exam, counseling, chart review was performed.   Marilin Kofman DAVID, MD   07/09/2014

## 2014-07-09 NOTE — Patient Instructions (Signed)

## 2014-07-10 ENCOUNTER — Other Ambulatory Visit: Payer: Self-pay | Admitting: Internal Medicine

## 2014-07-10 LAB — TSH: TSH: 3.927 u[IU]/mL (ref 0.350–4.500)

## 2014-07-10 LAB — INSULIN, RANDOM: Insulin: 32.4 u[IU]/mL — ABNORMAL HIGH (ref 2.0–19.6)

## 2014-07-10 LAB — HEMOGLOBIN A1C
Hgb A1c MFr Bld: 6.6 % — ABNORMAL HIGH (ref <5.7)
MEAN PLASMA GLUCOSE: 143 mg/dL — AB (ref <117)

## 2014-07-10 LAB — VITAMIN D 25 HYDROXY (VIT D DEFICIENCY, FRACTURES): Vit D, 25-Hydroxy: 45 ng/mL (ref 30–100)

## 2014-07-12 ENCOUNTER — Ambulatory Visit: Payer: Self-pay | Admitting: Internal Medicine

## 2014-07-12 ENCOUNTER — Telehealth: Payer: Self-pay | Admitting: *Deleted

## 2014-07-12 NOTE — Telephone Encounter (Signed)
Pt aware of lab results 

## 2014-07-14 ENCOUNTER — Other Ambulatory Visit: Payer: Self-pay | Admitting: Internal Medicine

## 2014-07-19 ENCOUNTER — Other Ambulatory Visit: Payer: Self-pay

## 2014-07-19 DIAGNOSIS — E785 Hyperlipidemia, unspecified: Secondary | ICD-10-CM

## 2014-07-19 MED ORDER — ATORVASTATIN CALCIUM 80 MG PO TABS: ORAL_TABLET | ORAL | Status: DC

## 2014-07-19 MED ORDER — FLUOXETINE HCL 20 MG PO CAPS: 20.0000 mg | ORAL_CAPSULE | Freq: Every day | ORAL | Status: DC

## 2014-07-19 MED ORDER — ALENDRONATE SODIUM 70 MG PO TABS: ORAL_TABLET | ORAL | Status: DC

## 2014-07-19 MED ORDER — ALPRAZOLAM 1 MG PO TABS: 1.0000 mg | ORAL_TABLET | Freq: Three times a day (TID) | ORAL | Status: DC | PRN

## 2014-07-19 MED ORDER — FUROSEMIDE 40 MG PO TABS: 40.0000 mg | ORAL_TABLET | Freq: Two times a day (BID) | ORAL | Status: DC

## 2014-07-19 MED ORDER — POTASSIUM CHLORIDE CRYS ER 20 MEQ PO TBCR: 20.0000 meq | EXTENDED_RELEASE_TABLET | Freq: Four times a day (QID) | ORAL | Status: DC

## 2014-08-02 ENCOUNTER — Other Ambulatory Visit: Payer: Self-pay | Admitting: *Deleted

## 2014-08-02 DIAGNOSIS — E785 Hyperlipidemia, unspecified: Secondary | ICD-10-CM

## 2014-08-02 MED ORDER — ATORVASTATIN CALCIUM 80 MG PO TABS: ORAL_TABLET | ORAL | Status: AC

## 2014-08-03 ENCOUNTER — Other Ambulatory Visit: Payer: Self-pay | Admitting: Internal Medicine

## 2014-08-03 MED ORDER — FLUOXETINE HCL 20 MG PO CAPS: 20.0000 mg | ORAL_CAPSULE | Freq: Every day | ORAL | Status: DC

## 2014-08-03 MED ORDER — ALENDRONATE SODIUM 70 MG PO TABS: ORAL_TABLET | ORAL | Status: DC

## 2014-08-03 MED ORDER — FUROSEMIDE 40 MG PO TABS: 40.0000 mg | ORAL_TABLET | Freq: Two times a day (BID) | ORAL | Status: DC

## 2014-10-07 ENCOUNTER — Encounter: Payer: Self-pay | Admitting: Internal Medicine

## 2014-10-19 ENCOUNTER — Ambulatory Visit (INDEPENDENT_AMBULATORY_CARE_PROVIDER_SITE_OTHER): Payer: Medicare PPO | Admitting: Internal Medicine

## 2014-10-19 ENCOUNTER — Encounter: Payer: Self-pay | Admitting: Internal Medicine

## 2014-10-19 VITALS — BP 126/70 | HR 64 | Temp 98.0°F | Resp 18 | Ht 63.5 in | Wt 201.0 lb

## 2014-10-19 DIAGNOSIS — I10 Essential (primary) hypertension: Secondary | ICD-10-CM

## 2014-10-19 DIAGNOSIS — E785 Hyperlipidemia, unspecified: Secondary | ICD-10-CM

## 2014-10-19 DIAGNOSIS — R7309 Other abnormal glucose: Secondary | ICD-10-CM

## 2014-10-19 DIAGNOSIS — Z79899 Other long term (current) drug therapy: Secondary | ICD-10-CM

## 2014-10-19 DIAGNOSIS — E559 Vitamin D deficiency, unspecified: Secondary | ICD-10-CM

## 2014-10-19 DIAGNOSIS — R7303 Prediabetes: Secondary | ICD-10-CM

## 2014-10-19 DIAGNOSIS — N182 Chronic kidney disease, stage 2 (mild): Secondary | ICD-10-CM

## 2014-10-19 DIAGNOSIS — R300 Dysuria: Secondary | ICD-10-CM

## 2014-10-19 MED ORDER — FLUOXETINE HCL 10 MG PO CAPS: 30.0000 mg | ORAL_CAPSULE | Freq: Every day | ORAL | Status: DC

## 2014-10-19 MED ORDER — PHENTERMINE HCL 37.5 MG PO TABS: 37.5000 mg | ORAL_TABLET | Freq: Every day | ORAL | Status: DC

## 2014-10-19 NOTE — Progress Notes (Signed)
Assessment and Plan:   1. Essential hypertension -monitor at home -diet and exercise -cont meds - TSH  2. Prediabetes  - Hemoglobin A1c - Insulin, random  3. Hyperlipidemia -cont diet and exercise -weight loss recommended - Lipid panel  4. Vitamin D deficiency -cont supplement - Vit D  25 hydroxy (rtn osteoporosis monitoring)  5. Medication management  - CBC with Differential/Platelet - BASIC METABOLIC PANEL WITH GFR - Hepatic function panel - Magnesium  6. CKD (chronic kidney disease) stage 2, GFR 60-89 ml/min -drink plenty of water  -avoid NSAIDs  7. Dysuria  - Urine culture - Urinalysis, Routine w reflex microscopic (not at [facility])  8. Depression - increase prozac to 30 mg daily    Continue diet and meds as discussed. Further disposition pending results of labs.  HPI 69 y.o. female  presents for 3 month follow up with hypertension, hyperlipidemia, prediabetes and vitamin D.   Her blood pressure has been controlled at home, today their BP is BP: 126/70 mmHg.   She does not workout. She denies chest pain, shortness of breath, dizziness.   She is on cholesterol medication and denies myalgias. Her cholesterol is at goal. The cholesterol last visit was:   Lab Results  Component Value Date   CHOL 163 07/09/2014   HDL 40* 07/09/2014   LDLCALC 83 07/09/2014   TRIG 199* 07/09/2014   CHOLHDL 4.1 07/09/2014     She has been working on diet and exercise for prediabetes, and denies foot ulcerations, hyperglycemia, hypoglycemia , increased appetite, nausea, paresthesia of the feet, polydipsia, polyuria, visual disturbances, vomiting and weight loss. Last A1C in the office was:  Lab Results  Component Value Date   HGBA1C 6.6* 07/09/2014    Patient is on Vitamin D supplement.  Lab Results  Component Value Date   VD25OH 45 07/09/2014     Patient is concerned because she feels like she is having really poor energy currently.  She reports that ever since her fall  six months ago she reports that she does not have the drive or the energy to get up and do things.     Current Medications:  Current Outpatient Prescriptions on File Prior to Visit  Medication Sig Dispense Refill  . ACCU-CHEK SOFTCLIX LANCETS lancets by Other route. Check Blood Sugar Once Daily    . alendronate (FOSAMAX) 70 MG tablet TAKE 1 TABLET BY MOUTH ONCE WEEKLY **TAKE FULL GLASS OF WATER ON AN EMPTY STOMACH 12 tablet 0  . ALPRAZolam (XANAX) 1 MG tablet Take 1 tablet (1 mg total) by mouth 3 (three) times daily as needed. 270 tablet 1  . aspirin 81 MG tablet Take 81 mg by mouth daily.    Marland Kitchen atorvastatin (LIPITOR) 80 MG tablet Take one tablet nightly for cholesterol 90 tablet 3  . Blood Glucose Calibration (ACCU-CHEK AVIVA) SOLN by In Vitro route.    . Blood Glucose Monitoring Suppl (ACCU-CHEK AVIVA PLUS) W/DEVICE KIT by Does not apply route. Check Blood Sugar Once Daily    . Cholecalciferol (VITAMIN D PO) Take 2,000 Units by mouth 2 (two) times daily.    Marland Kitchen esomeprazole (NEXIUM) 40 MG capsule Take 1 capsule (40 mg total) by mouth daily. 90 capsule 3  . FLUoxetine (PROZAC) 20 MG capsule Take 1 capsule (20 mg total) by mouth daily. 90 capsule 0  . furosemide (LASIX) 40 MG tablet Take 1 tablet (40 mg total) by mouth 2 (two) times daily. 180 tablet 0  . glucose blood test strip 1  each by Other route as needed for other. Check Blood Sugar Once Daily    . Magnesium Oxide 250 MG TABS Take by mouth.    . potassium chloride SA (KLOR-CON M20) 20 MEQ tablet Take 1 tablet (20 mEq total) by mouth 4 (four) times daily. 360 tablet 3   No current facility-administered medications on file prior to visit.    Medical History:  Past Medical History  Diagnosis Date  . Hypertension   . Hyperlipidemia   . GERD (gastroesophageal reflux disease)   . Depression   . Obesity   . Prediabetes   . Vitamin D deficiency     Allergies:  Allergies  Allergen Reactions  . Penicillins     Rash  . Wellbutrin  [Bupropion]      Review of Systems:  Review of Systems  Constitutional: Positive for malaise/fatigue. Negative for chills and diaphoresis.  HENT: Negative for congestion, ear pain and sore throat.   Eyes: Negative.   Respiratory: Negative for cough, shortness of breath and wheezing.   Cardiovascular: Negative for chest pain, palpitations and claudication.  Gastrointestinal: Negative for heartburn, diarrhea, constipation, blood in stool and melena.  Genitourinary: Negative.   Skin: Negative.   Neurological: Negative for dizziness, sensory change, loss of consciousness and headaches.  Psychiatric/Behavioral: Positive for depression. The patient is nervous/anxious. The patient does not have insomnia.     Family history- Review and unchanged  Social history- Review and unchanged  Physical Exam: BP 126/70 mmHg  Pulse 64  Temp(Src) 98 F (36.7 C) (Temporal)  Resp 18  Ht 5' 3.5" (1.613 m)  Wt 201 lb (91.173 kg)  BMI 35.04 kg/m2 Wt Readings from Last 3 Encounters:  10/19/14 201 lb (91.173 kg)  07/09/14 212 lb 3.2 oz (96.253 kg)  04/06/14 207 lb 14.4 oz (94.303 kg)    General Appearance: Well nourished well developed, in no apparent distress. Eyes: PERRLA, EOMs, conjunctiva no swelling or erythema ENT/Mouth: Ear canals normal without obstruction, swelling, erythma, discharge.  TMs normal bilaterally.  Oropharynx moist, clear, without exudate, or postoropharyngeal swelling. Neck: Supple, thyroid normal,no cervical adenopathy  Respiratory: Respiratory effort normal, Breath sounds clear A&P without rhonchi, wheeze, or rale.  No retractions, no accessory usage. Cardio: RRR with no MRGs. Brisk peripheral pulses without edema.  Abdomen: Soft, + BS,  Non tender, no guarding, rebound, hernias, masses. Musculoskeletal: Full ROM, 5/5 strength, Normal gait Skin: Peeling and pink with obvious sunburn on face and legs. Warm, dry without rashes, lesions, ecchymosis.  Neuro: Awake and oriented X  3, Cranial nerves intact. Normal muscle tone, no cerebellar symptoms. Psych: Normal affect, Insight and Judgment appropriate.    Starlyn Skeans, PA-C 4:26 PM [facility]

## 2014-10-20 LAB — CBC WITH DIFFERENTIAL/PLATELET
Basophils Absolute: 0 10*3/uL (ref 0.0–0.1)
Basophils Relative: 0 % (ref 0–1)
EOS ABS: 0.2 10*3/uL (ref 0.0–0.7)
Eosinophils Relative: 2 % (ref 0–5)
HCT: 39.7 % (ref 36.0–46.0)
HEMOGLOBIN: 12.9 g/dL (ref 12.0–15.0)
Lymphocytes Relative: 51 % — ABNORMAL HIGH (ref 12–46)
Lymphs Abs: 3.8 10*3/uL (ref 0.7–4.0)
MCH: 28.9 pg (ref 26.0–34.0)
MCHC: 32.5 g/dL (ref 30.0–36.0)
MCV: 89 fL (ref 78.0–100.0)
MPV: 10.6 fL (ref 8.6–12.4)
Monocytes Absolute: 0.4 10*3/uL (ref 0.1–1.0)
Monocytes Relative: 5 % (ref 3–12)
NEUTROS ABS: 3.2 10*3/uL (ref 1.7–7.7)
NEUTROS PCT: 42 % — AB (ref 43–77)
Platelets: 211 10*3/uL (ref 150–400)
RBC: 4.46 MIL/uL (ref 3.87–5.11)
RDW: 13.6 % (ref 11.5–15.5)
WBC: 7.5 10*3/uL (ref 4.0–10.5)

## 2014-10-20 LAB — LIPID PANEL
Cholesterol: 129 mg/dL (ref 0–200)
HDL: 43 mg/dL — ABNORMAL LOW (ref >=46)
LDL CALC: 65 mg/dL (ref 0–99)
Total CHOL/HDL Ratio: 3 Ratio
Triglycerides: 105 mg/dL (ref <150)
VLDL: 21 mg/dL (ref 0–40)

## 2014-10-20 LAB — URINALYSIS, MICROSCOPIC ONLY
CASTS: NONE SEEN
Crystals: NONE SEEN

## 2014-10-20 LAB — HEPATIC FUNCTION PANEL
ALK PHOS: 45 U/L (ref 39–117)
ALT: 27 U/L (ref 0–35)
AST: 24 U/L (ref 0–37)
Albumin: 4.1 g/dL (ref 3.5–5.2)
BILIRUBIN TOTAL: 0.6 mg/dL (ref 0.2–1.2)
Bilirubin, Direct: 0.1 mg/dL (ref 0.0–0.3)
Indirect Bilirubin: 0.5 mg/dL (ref 0.2–1.2)
Total Protein: 6.9 g/dL (ref 6.0–8.3)

## 2014-10-20 LAB — BASIC METABOLIC PANEL WITH GFR
BUN: 11 mg/dL (ref 6–23)
CALCIUM: 9.4 mg/dL (ref 8.4–10.5)
CHLORIDE: 106 meq/L (ref 96–112)
CO2: 26 mEq/L (ref 19–32)
Creat: 0.9 mg/dL (ref 0.50–1.10)
GFR, EST AFRICAN AMERICAN: 74 mL/min
GFR, EST NON AFRICAN AMERICAN: 65 mL/min
Glucose, Bld: 90 mg/dL (ref 70–99)
Potassium: 4.7 mEq/L (ref 3.5–5.3)
Sodium: 143 mEq/L (ref 135–145)

## 2014-10-20 LAB — URINALYSIS, ROUTINE W REFLEX MICROSCOPIC
BILIRUBIN URINE: NEGATIVE
GLUCOSE, UA: NEGATIVE mg/dL
Hgb urine dipstick: NEGATIVE
Ketones, ur: NEGATIVE mg/dL
Nitrite: NEGATIVE
PH: 6.5 (ref 5.0–8.0)
Protein, ur: NEGATIVE mg/dL
SPECIFIC GRAVITY, URINE: 1.016 (ref 1.005–1.030)
UROBILINOGEN UA: 0.2 mg/dL (ref 0.0–1.0)

## 2014-10-20 LAB — TSH: TSH: 1.761 u[IU]/mL (ref 0.350–4.500)

## 2014-10-20 LAB — VITAMIN D 25 HYDROXY (VIT D DEFICIENCY, FRACTURES): Vit D, 25-Hydroxy: 73 ng/mL (ref 30–100)

## 2014-10-20 LAB — HEMOGLOBIN A1C
Hgb A1c MFr Bld: 6.2 % — ABNORMAL HIGH (ref <5.7)
MEAN PLASMA GLUCOSE: 131 mg/dL — AB (ref <117)

## 2014-10-20 LAB — INSULIN, RANDOM: Insulin: 21.4 u[IU]/mL — ABNORMAL HIGH (ref 2.0–19.6)

## 2014-10-20 LAB — MAGNESIUM: MAGNESIUM: 2 mg/dL (ref 1.5–2.5)

## 2014-10-21 ENCOUNTER — Other Ambulatory Visit: Payer: Self-pay | Admitting: *Deleted

## 2014-10-21 LAB — URINE CULTURE

## 2014-10-21 MED ORDER — FLUOXETINE HCL 20 MG PO TABS: 30.0000 mg | ORAL_TABLET | Freq: Every day | ORAL | Status: DC

## 2014-10-28 ENCOUNTER — Other Ambulatory Visit: Payer: Self-pay | Admitting: Internal Medicine

## 2014-11-24 ENCOUNTER — Encounter: Payer: Self-pay | Admitting: Internal Medicine

## 2015-01-25 ENCOUNTER — Other Ambulatory Visit: Payer: Self-pay | Admitting: Internal Medicine

## 2015-02-10 ENCOUNTER — Encounter: Payer: Self-pay | Admitting: Internal Medicine

## 2018-05-08 ENCOUNTER — Emergency Department (HOSPITAL_BASED_OUTPATIENT_CLINIC_OR_DEPARTMENT_OTHER): Payer: Medicare HMO

## 2018-05-08 ENCOUNTER — Emergency Department (HOSPITAL_BASED_OUTPATIENT_CLINIC_OR_DEPARTMENT_OTHER)
Admission: EM | Admit: 2018-05-08 | Discharge: 2018-05-08 | Disposition: A | Discharge status: Home / Self Care | Payer: Medicare HMO | Attending: Emergency Medicine | Admitting: Emergency Medicine

## 2018-05-08 ENCOUNTER — Other Ambulatory Visit: Payer: Self-pay

## 2018-05-08 ENCOUNTER — Encounter (HOSPITAL_BASED_OUTPATIENT_CLINIC_OR_DEPARTMENT_OTHER): Payer: Self-pay

## 2018-05-08 DIAGNOSIS — M25332 Other instability, left wrist: Secondary | ICD-10-CM | POA: Diagnosis not present

## 2018-05-08 DIAGNOSIS — S52532A Colles' fracture of left radius, initial encounter for closed fracture: Secondary | ICD-10-CM | POA: Diagnosis not present

## 2018-05-08 DIAGNOSIS — Y998 Other external cause status: Secondary | ICD-10-CM | POA: Insufficient documentation

## 2018-05-08 DIAGNOSIS — S59912A Unspecified injury of left forearm, initial encounter: Secondary | ICD-10-CM | POA: Diagnosis present

## 2018-05-08 DIAGNOSIS — Y9389 Activity, other specified: Secondary | ICD-10-CM | POA: Insufficient documentation

## 2018-05-08 DIAGNOSIS — W010XXA Fall on same level from slipping, tripping and stumbling without subsequent striking against object, initial encounter: Secondary | ICD-10-CM | POA: Diagnosis not present

## 2018-05-08 DIAGNOSIS — Y9289 Other specified places as the place of occurrence of the external cause: Secondary | ICD-10-CM | POA: Insufficient documentation

## 2018-05-08 MED ORDER — HYDROCODONE-ACETAMINOPHEN 5-325 MG PO TABS: 1.0000 | ORAL_TABLET | Freq: Four times a day (QID) | ORAL | 0 refills | Status: DC | PRN

## 2018-05-08 NOTE — ED Triage Notes (Signed)
Pt states she fell this afternoon-pain to right shoulder and left wrist-NAD-steady gait

## 2018-05-08 NOTE — ED Provider Notes (Signed)
[place] EMERGENCY DEPARTMENT Provider Note   CSN: [id number] Arrival date & time: 05/08/18  2045     History   Chief Complaint Chief Complaint  Patient presents with  . Fall    HPI [name] is a 73 y.o. female who presents emergency department chief complaint of left wrist pain after mechanical fall.  Patient states that she tripped over her own feet, bounced off of the wall and fell onto an outstretched left hand.  She suffered some minor abrasions to the left elbow and has some complaint of soreness in the left arm and shoulder region.  Patient noticed immediate swelling and deformity of the right wrist. She denies numbness or tingling of the fingers  HPI  Past Medical History:  Diagnosis Date  . Depression   . GERD (gastroesophageal reflux disease)   . Hyperlipidemia   . Hypertension   . Obesity   . Prediabetes   . Vitamin D deficiency     Patient Active Problem List   Diagnosis Date Noted  . Medication management 07/09/2014  . Depression, controlled 07/09/2014  . Osteoporosis 01/04/2014  . GERD (gastroesophageal reflux disease) 01/04/2014  . CKD (chronic kidney disease) stage 2, GFR 60-89 ml/min 01/04/2014  . Fibromyalgia 01/04/2014  . Hypertension   . Hyperlipidemia   . Morbid obesity (BMI 37)    . Prediabetes   . Vitamin D deficiency     Past Surgical History:  Procedure Laterality Date  . ABDOMINAL HYSTERECTOMY    . CARPAL TUNNEL RELEASE Bilateral   . LASIK    . PILONIDAL CYST EXCISION  1978  . SKIN CANCER EXCISION       OB History   No obstetric history on file.      Home Medications    Prior to Admission medications   Medication Sig Start Date End Date Taking? Authorizing Provider  ACCU-CHEK SOFTCLIX LANCETS lancets by Other route. Check Blood Sugar Once Daily    [provider]  alendronate (FOSAMAX) 70 MG tablet TAKE 1 TABLET BY MOUTH ONCE WEEKLY **TAKE FULL GLASS OF WATER ON AN EMPTY STOMACH 08/03/14   Unk Pinto, MD  ALPRAZolam Duanne Moron) 1 MG tablet Take 1 tablet (1 mg total) by mouth 3 (three) times daily as needed. 07/19/14   Unk Pinto, MD  aspirin 81 MG tablet Take 81 mg by mouth daily.    [provider]  atorvastatin (LIPITOR) 80 MG tablet Take one tablet nightly for cholesterol 08/02/14   Unk Pinto, MD  Blood Glucose Calibration (ACCU-CHEK AVIVA) SOLN by In Vitro route.    [provider]  Blood Glucose Monitoring Suppl (ACCU-CHEK AVIVA PLUS) W/DEVICE KIT by Does not apply route. Check Blood Sugar Once Daily    [provider]  Cholecalciferol (VITAMIN D PO) Take 2,000 Units by mouth 2 (two) times daily.    [provider]  esomeprazole (NEXIUM) 40 MG capsule Take 1 capsule (40 mg total) by mouth daily. 04/08/14 04/08/15  Vicie Mutters, PA-C  FLUoxetine (PROZAC) 20 MG tablet Take 1.5 tablets (30 mg total) by mouth daily. Patient takes 30 mg daily = 1 and 1/2 tablet. 10/21/14   Rolene Course, PA-C  furosemide (LASIX) 40 MG tablet Take 1 tablet (40 mg total) by mouth 2 (two) times daily. 08/03/14   Unk Pinto, MD  glucose blood test strip 1 each by Other route as needed for other. Check Blood Sugar Once Daily    [provider]  Magnesium Oxide 250 MG  TABS Take by mouth.    [provider]  phentermine (ADIPEX-P) 37.5 MG tablet Take 1 tablet (37.5 mg total) by mouth daily before breakfast. 10/19/14   Rolene Course, PA-C  potassium chloride SA (KLOR-CON M20) 20 MEQ tablet Take 1 tablet (20 mEq total) by mouth 4 (four) times daily. 07/19/14   Unk Pinto, MD    Family History Family History  Problem Relation Age of Onset  . Diabetes Mother   . Heart disease Mother   . Hypertension Mother   . Heart disease Father     Social History Social History   Tobacco Use  . Smoking status: Former Smoker    Last attempt to quit: 07/01/1998    Years since quitting: 19.8  . Smokeless tobacco: Never Used  Substance Use  Topics  . Alcohol use: No  . Drug use: No     Allergies   Penicillins and Wellbutrin [bupropion]   Review of Systems Review of Systems Ten systems reviewed and are negative for acute change, except as noted in the HPI.    Physical Exam Updated Vital Signs BP (!) 150/76 (BP Location: Right Arm)   Pulse 87   Temp 97.8 F (36.6 C) (Oral)   Resp 16   Ht '5\' 3"'  (1.6 m)   Wt 83 kg   SpO2 99%   BMI 32.42 kg/m   Physical Exam Vitals signs and nursing note reviewed.  Constitutional:      General: She is not in acute distress.    Appearance: She is well-developed. She is not diaphoretic.  HENT:     Head: Normocephalic and atraumatic.  Eyes:     General: No scleral icterus.    Conjunctiva/sclera: Conjunctivae normal.  Neck:     Musculoskeletal: Normal range of motion.  Cardiovascular:     Rate and Rhythm: Normal rate and regular rhythm.     Heart sounds: Normal heart sounds. No murmur. No friction rub. No gallop.   Pulmonary:     Effort: Pulmonary effort is normal. No respiratory distress.     Breath sounds: Normal breath sounds.  Abdominal:     General: Bowel sounds are normal. There is no distension.     Palpations: Abdomen is soft. There is no mass.     Tenderness: There is no abdominal tenderness. There is no guarding.  Musculoskeletal:        General: Deformity present.     Left wrist: She exhibits tenderness, bony tenderness and deformity.  Skin:    General: Skin is warm and dry.  Neurological:     Mental Status: She is alert and oriented to person, place, and time.  Psychiatric:        Behavior: Behavior normal.      ED Treatments / Results  Labs (all labs ordered are listed, but only abnormal results are displayed) Labs Reviewed - No data to display  EKG None  Radiology Dg Elbow Complete Right  Result Date: 05/08/2018 CLINICAL DATA:  [place] today at home. EXAM: RIGHT ELBOW - COMPLETE 3+ VIEW COMPARISON:  None. FINDINGS: The joint spaces are  maintained. No acute fracture is identified. No joint effusion. IMPRESSION: No acute fracture or joint effusion. Electronically Signed   By: Marijo Sanes M.D.   On: 05/08/2018 21:56   Dg Wrist Complete Left  Result Date: 05/08/2018 CLINICAL DATA:  [place] today at home.  Left wrist pain and bruising. EXAM: LEFT WRIST - COMPLETE 3+ VIEW COMPARISON:  None. FINDINGS: Comminuted intra-articular Colles type  fracture of the distal radius with dorsal impaction. No fracture of the ulnar styloid. The scapholunate joint space is widened which may suggest a scapholunate ligament injury. IMPRESSION: Comminuted intra-articular Colles type fracture involving the distal radius. No ulnar styloid fracture. Widened scapholunate joint space suggesting scapholunate ligament injury. Electronically Signed   By: Marijo Sanes M.D.   On: 05/08/2018 21:55    Procedures Procedures (including critical care time)  Medications Ordered in ED Medications - No data to display  SPLINT APPLICATION Date/Time: 20:99 AM Authorized by: Margarita Mail Consent: Verbal consent obtained. Risks and benefits: risks, benefits and alternatives were discussed Consent given by: patient Splint applied by: orthopedic technician Location details: Left wrist Splint type: Sugar tong Supplies used: Ortho-Glass Post-procedure: The splinted body part was neurovascularly unchanged following the procedure. Patient tolerance: Patient tolerated the procedure well with no immediate complications.    Initial Impression / Assessment and Plan / ED Course  I have reviewed the triage vital signs and the nursing notes.  Pertinent labs & imaging results that were available during my care of the patient were reviewed by me and considered in my medical decision making (see chart for details).    Patient with isolated left wrist fracture.  She has no neurovascular compromise.  Patient placed in sugar tong splint with sling.  She is neurovascularly intact  after splint placement. Patient will follow-up with Ortho hand.  She appears appropriate for discharge at this time  Final Clinical Impressions(s) / ED Diagnoses   Final diagnoses:  Closed Colles' fracture of left radius, initial encounter  Scapholunate dissociation of left wrist    ED Discharge Orders    None       Margarita Mail, PA-C 05/09/18 0041    Drenda Freeze, MD 05/10/18 1538

## 2018-05-08 NOTE — Discharge Instructions (Addendum)
Contact a health care provider if: Your splint or cast: Gets wet. Gets damaged. Suddenly feels too tight. You have: A fever or chills. Pain that does not get better with medicine. Swelling that gets worse. Get help right away if: Your hand or fingernails: Turn blue or gray. Feel cold or numb. You have tingling or numbness in your fingers, even after you loosen your splint (if this applies).

## 2019-02-18 IMAGING — DX DG WRIST COMPLETE 3+V*L*
4 series · 4 of 4 positions shown · non-contrast
Comparison: None.

CLINICAL DATA: Fell today at home.  Left wrist pain and bruising.

EXAM:
LEFT WRIST - COMPLETE 3+ VIEW

[wrist pa]
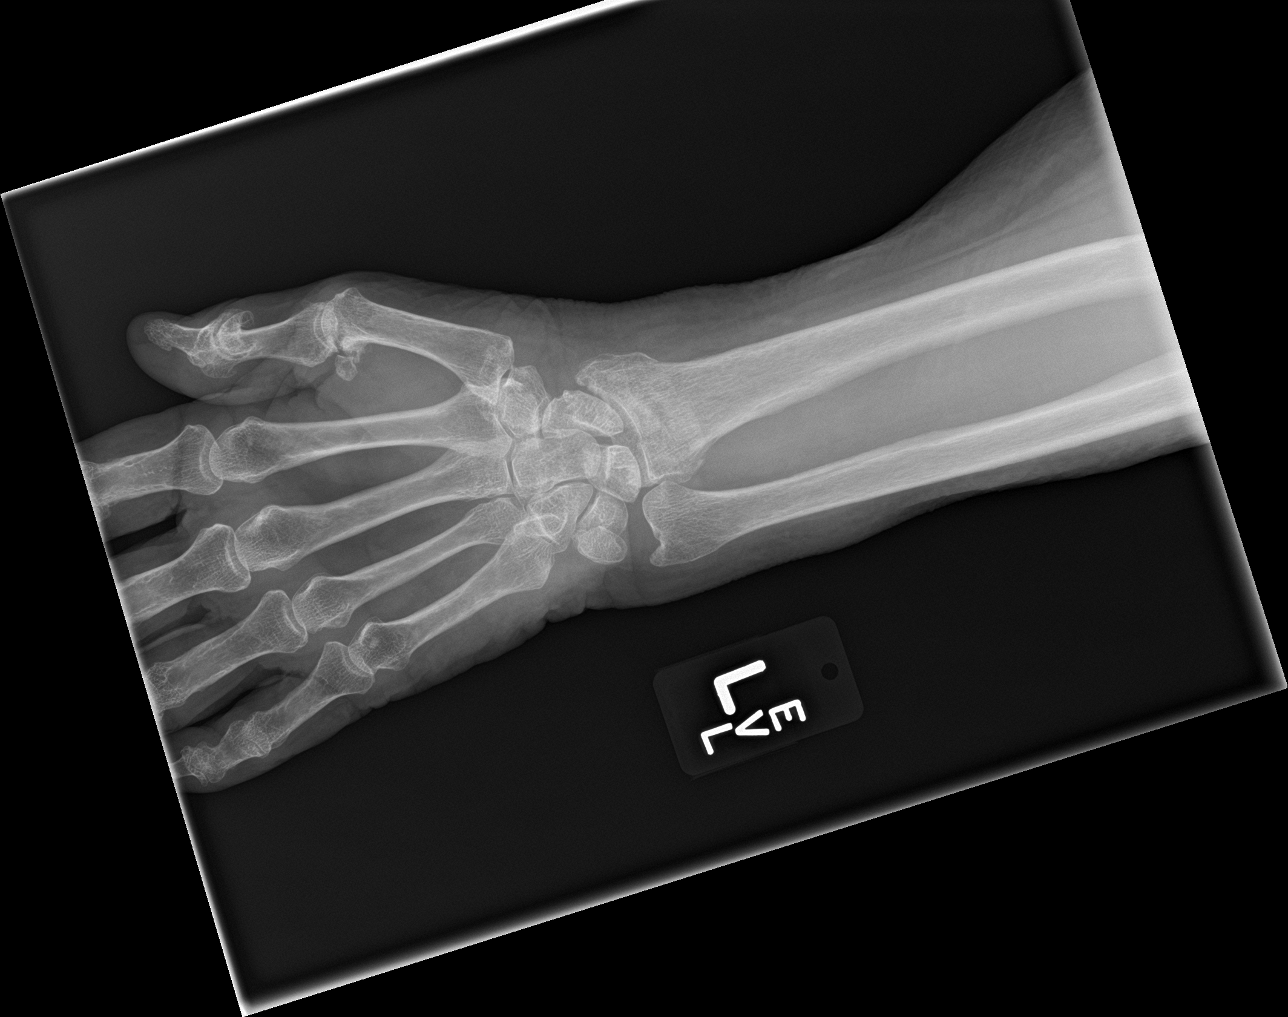

[wrist obl]
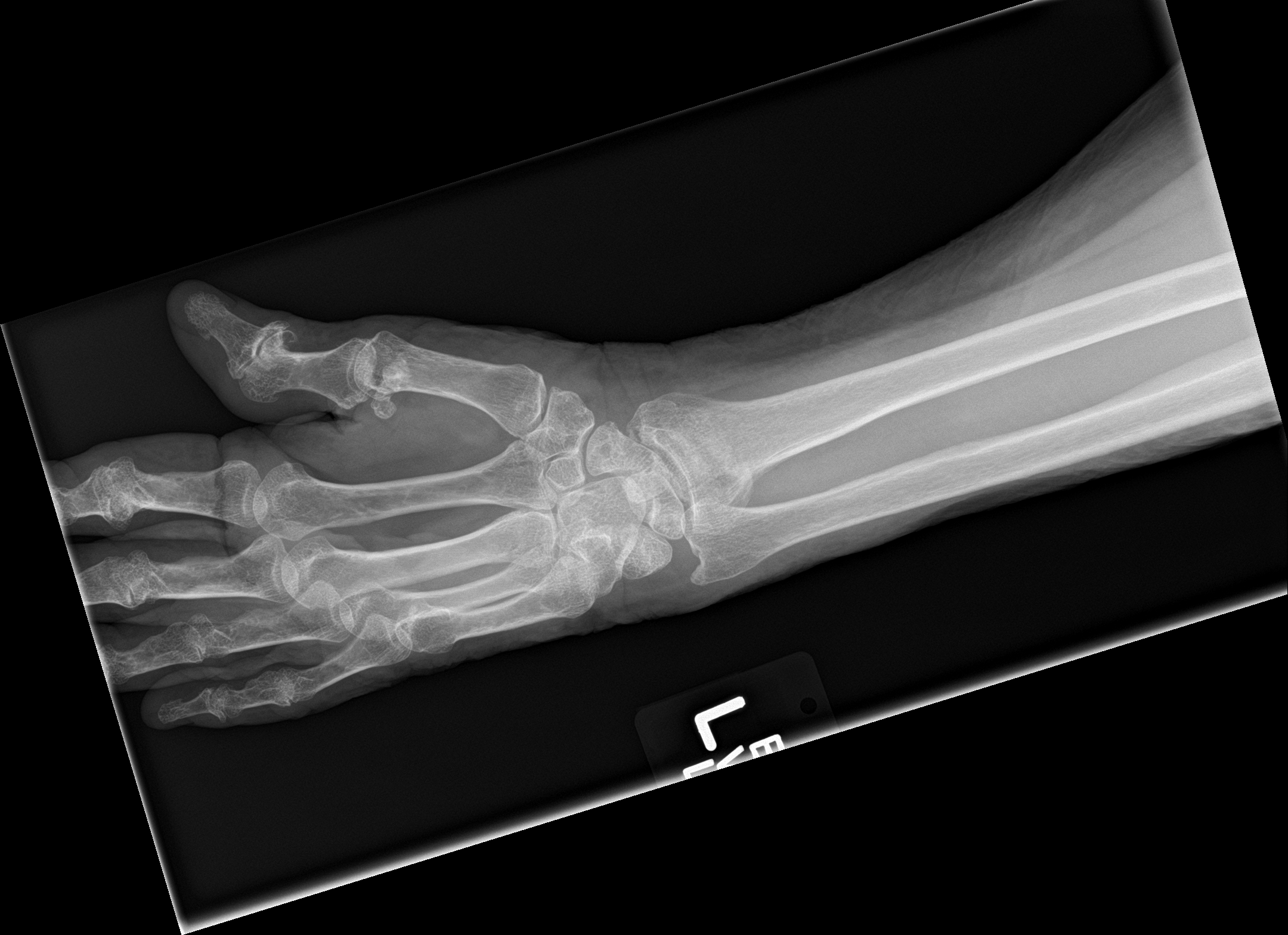

[wrist lat]
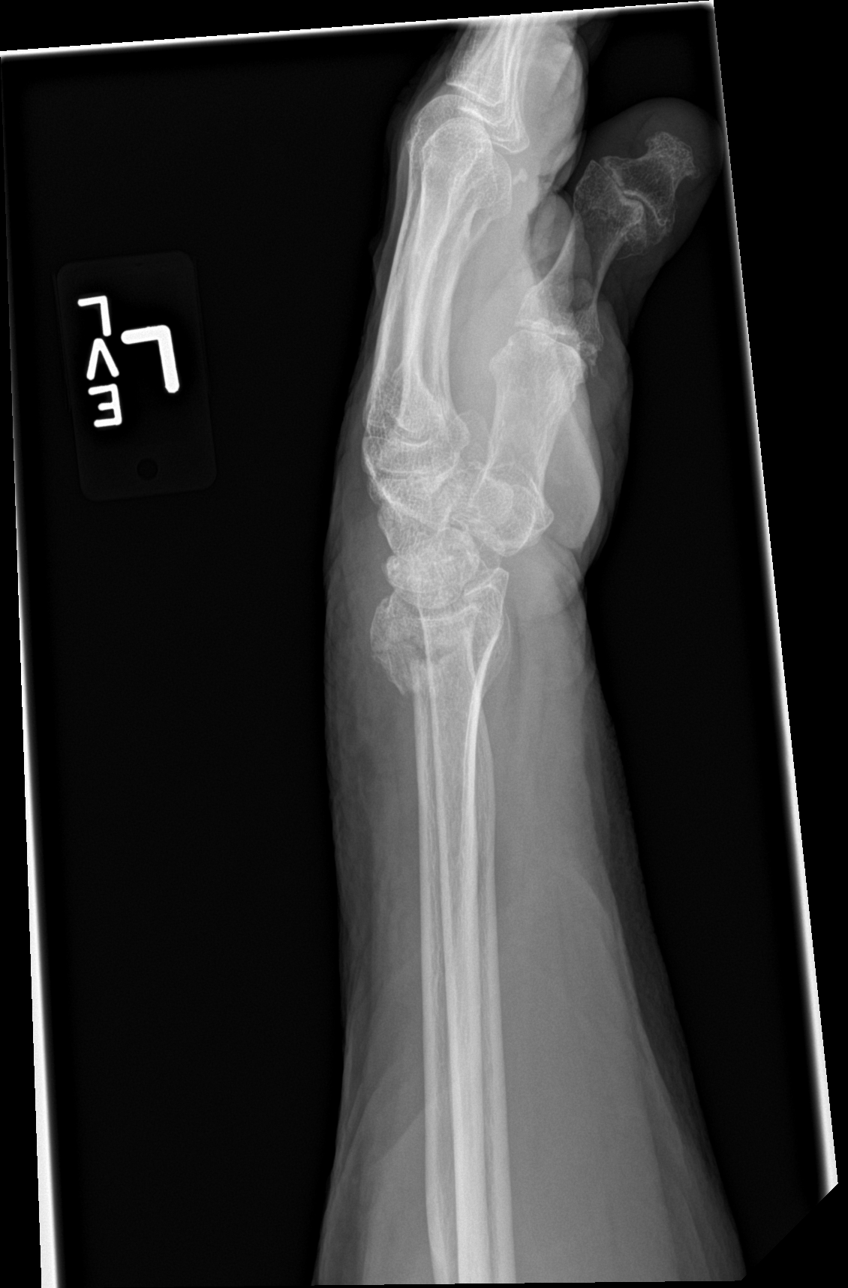

[wrist navicular]
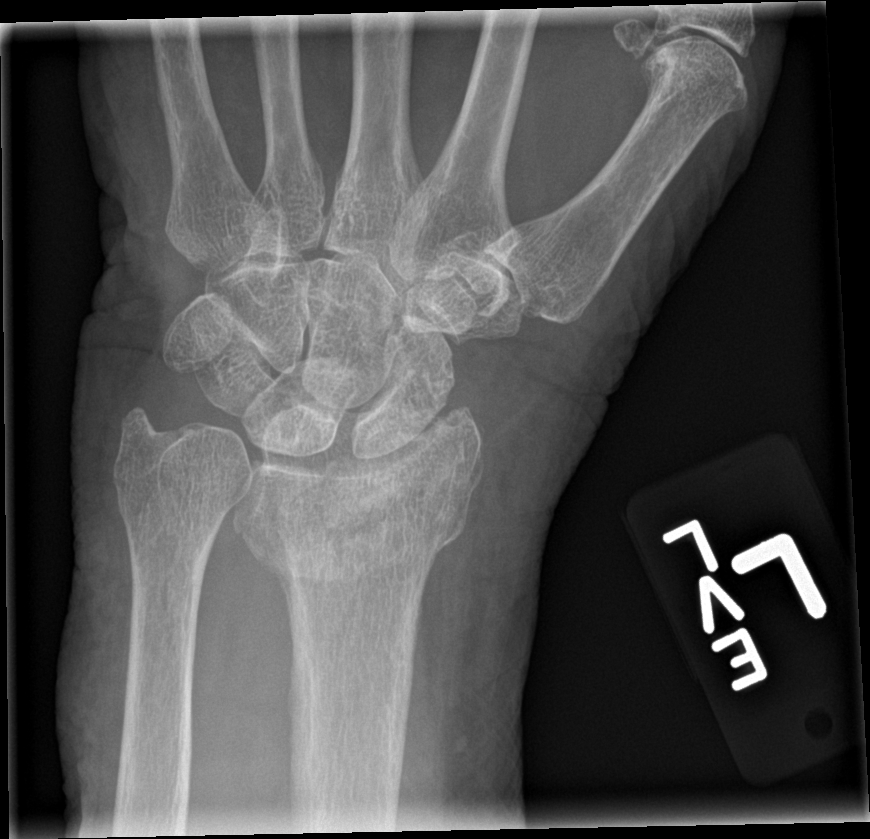

[4 of 4 positions shown; findings below may reference images not displayed]

FINDINGS: Comminuted intra-articular Colles type fracture of the distal radius
with dorsal impaction. No fracture of the ulnar styloid.

The scapholunate joint space is widened which may suggest a
scapholunate ligament injury.
IMPRESSION: Comminuted intra-articular Colles type fracture involving the distal
radius.

No ulnar styloid fracture.

Widened scapholunate joint space suggesting scapholunate ligament
injury.

## 2023-12-10 ENCOUNTER — Encounter (HOSPITAL_COMMUNITY): Payer: Self-pay

## 2023-12-10 ENCOUNTER — Emergency Department (HOSPITAL_COMMUNITY)

## 2023-12-10 ENCOUNTER — Other Ambulatory Visit: Payer: Self-pay

## 2023-12-10 ENCOUNTER — Inpatient Hospital Stay (HOSPITAL_COMMUNITY)
Admission: EM | Admit: 2023-12-10 | Discharge: 2023-12-17 | DRG: 481 | Disposition: A | Discharge status: Skilled Nursing Facility | Attending: Internal Medicine | Admitting: Internal Medicine

## 2023-12-10 DIAGNOSIS — I10 Essential (primary) hypertension: Secondary | ICD-10-CM | POA: Diagnosis present

## 2023-12-10 DIAGNOSIS — F0394 Unspecified dementia, unspecified severity, with anxiety: Secondary | ICD-10-CM | POA: Diagnosis present

## 2023-12-10 DIAGNOSIS — S79811A Other specified injuries of right hip, initial encounter: Secondary | ICD-10-CM | POA: Diagnosis not present

## 2023-12-10 DIAGNOSIS — Z880 Allergy status to penicillin: Secondary | ICD-10-CM

## 2023-12-10 DIAGNOSIS — Z7983 Long term (current) use of bisphosphonates: Secondary | ICD-10-CM

## 2023-12-10 DIAGNOSIS — E785 Hyperlipidemia, unspecified: Secondary | ICD-10-CM | POA: Diagnosis present

## 2023-12-10 DIAGNOSIS — D62 Acute posthemorrhagic anemia: Secondary | ICD-10-CM | POA: Diagnosis not present

## 2023-12-10 DIAGNOSIS — F419 Anxiety disorder, unspecified: Secondary | ICD-10-CM | POA: Diagnosis present

## 2023-12-10 DIAGNOSIS — K219 Gastro-esophageal reflux disease without esophagitis: Secondary | ICD-10-CM | POA: Diagnosis present

## 2023-12-10 DIAGNOSIS — Z85828 Personal history of other malignant neoplasm of skin: Secondary | ICD-10-CM

## 2023-12-10 DIAGNOSIS — Z6830 Body mass index (BMI) 30.0-30.9, adult: Secondary | ICD-10-CM

## 2023-12-10 DIAGNOSIS — I340 Nonrheumatic mitral (valve) insufficiency: Secondary | ICD-10-CM | POA: Diagnosis present

## 2023-12-10 DIAGNOSIS — T84114A Breakdown (mechanical) of internal fixation device of right femur, initial encounter: Secondary | ICD-10-CM | POA: Diagnosis not present

## 2023-12-10 DIAGNOSIS — Z90710 Acquired absence of both cervix and uterus: Secondary | ICD-10-CM

## 2023-12-10 DIAGNOSIS — Z7982 Long term (current) use of aspirin: Secondary | ICD-10-CM

## 2023-12-10 DIAGNOSIS — Z87891 Personal history of nicotine dependence: Secondary | ICD-10-CM

## 2023-12-10 DIAGNOSIS — Z79899 Other long term (current) drug therapy: Secondary | ICD-10-CM

## 2023-12-10 DIAGNOSIS — Y792 Prosthetic and other implants, materials and accessory orthopedic devices associated with adverse incidents: Secondary | ICD-10-CM | POA: Diagnosis present

## 2023-12-10 DIAGNOSIS — F411 Generalized anxiety disorder: Secondary | ICD-10-CM | POA: Diagnosis present

## 2023-12-10 DIAGNOSIS — F0393 Unspecified dementia, unspecified severity, with mood disturbance: Secondary | ICD-10-CM | POA: Diagnosis present

## 2023-12-10 DIAGNOSIS — S7291XA Unspecified fracture of right femur, initial encounter for closed fracture: Secondary | ICD-10-CM | POA: Diagnosis present

## 2023-12-10 DIAGNOSIS — R7303 Prediabetes: Secondary | ICD-10-CM | POA: Diagnosis present

## 2023-12-10 DIAGNOSIS — M80051K Age-related osteoporosis with current pathological fracture, right femur, subsequent encounter for fracture with nonunion: Secondary | ICD-10-CM

## 2023-12-10 DIAGNOSIS — M797 Fibromyalgia: Secondary | ICD-10-CM | POA: Diagnosis present

## 2023-12-10 DIAGNOSIS — Z8249 Family history of ischemic heart disease and other diseases of the circulatory system: Secondary | ICD-10-CM

## 2023-12-10 DIAGNOSIS — G894 Chronic pain syndrome: Secondary | ICD-10-CM | POA: Diagnosis present

## 2023-12-10 DIAGNOSIS — Z751 Person awaiting admission to adequate facility elsewhere: Secondary | ICD-10-CM

## 2023-12-10 DIAGNOSIS — Z833 Family history of diabetes mellitus: Secondary | ICD-10-CM

## 2023-12-10 DIAGNOSIS — W050XXA Fall from non-moving wheelchair, initial encounter: Secondary | ICD-10-CM | POA: Diagnosis present

## 2023-12-10 DIAGNOSIS — F32A Depression, unspecified: Secondary | ICD-10-CM | POA: Diagnosis present

## 2023-12-10 DIAGNOSIS — Z888 Allergy status to other drugs, medicaments and biological substances status: Secondary | ICD-10-CM

## 2023-12-10 DIAGNOSIS — E66811 Obesity, class 1: Secondary | ICD-10-CM | POA: Diagnosis present

## 2023-12-10 DIAGNOSIS — S72001A Fracture of unspecified part of neck of right femur, initial encounter for closed fracture: Principal | ICD-10-CM

## 2023-12-10 LAB — CBC
HCT: 38.7 % (ref 36.0–46.0)
Hemoglobin: 12.3 g/dL (ref 12.0–15.0)
MCH: 30.1 pg (ref 26.0–34.0)
MCHC: 31.8 g/dL (ref 30.0–36.0)
MCV: 94.9 fL (ref 80.0–100.0)
Platelets: 264 K/uL (ref 150–400)
RBC: 4.08 MIL/uL (ref 3.87–5.11)
RDW: 13.5 % (ref 11.5–15.5)
WBC: 7.6 K/uL (ref 4.0–10.5)
nRBC: 0 % (ref 0.0–0.2)

## 2023-12-10 MED ORDER — ACETAMINOPHEN 500 MG PO TABS
1000.0000 mg | ORAL_TABLET | Freq: Once | ORAL | Status: AC
  Administered 2023-12-10: 1000 mg via ORAL
  Filled 2023-12-10: qty 2

## 2023-12-10 NOTE — ED Provider Notes (Signed)
 [place] EMERGENCY DEPARTMENT AT [facility] Provider Note   CSN: [id number] Arrival date & time: 12/10/23  2030     Patient presents with: [place]   [name] is a 78 y.o. female.   This is a an 78 year old female presenting emergency department after fall.  Was sitting on her rollator wheelchair when it slipped out from underneath of her causing her to fall onto her backside.  Did not hit her head no LOC.  Not on blood thinner.  Has had worsening hip pain on the right for roughly 1 week and now worse after fall.  No numbness tingling changes in sensation.   Fall       Prior to Admission medications   Medication Sig Start Date End Date Taking? Authorizing Provider  ACCU-CHEK SOFTCLIX LANCETS lancets by Other route. Check Blood Sugar Once Daily    [provider]  alendronate  (FOSAMAX ) 70 MG tablet TAKE 1 TABLET BY MOUTH ONCE WEEKLY **TAKE FULL GLASS OF WATER ON AN EMPTY STOMACH 08/03/14   Tonita Fallow, MD  ALPRAZolam  (XANAX ) 1 MG tablet Take 1 tablet (1 mg total) by mouth 3 (three) times daily as needed. 07/19/14   Tonita Fallow, MD  aspirin 81 MG tablet Take 81 mg by mouth daily.    [provider]  atorvastatin  (LIPITOR) 80 MG tablet Take one tablet nightly for cholesterol 08/02/14   Tonita Fallow, MD  Blood Glucose Calibration (ACCU-CHEK AVIVA) SOLN by In Vitro route.    [provider]  Blood Glucose Monitoring Suppl (ACCU-CHEK AVIVA PLUS) W/DEVICE KIT by Does not apply route. Check Blood Sugar Once Daily    [provider]  Cholecalciferol  (VITAMIN D  PO) Take 2,000 Units by mouth 2 (two) times daily.    [provider]  esomeprazole  (NEXIUM ) 40 MG capsule Take 1 capsule (40 mg total) by mouth daily. 04/08/14 04/08/15  Craig Alan SAUNDERS, PA-C  FLUoxetine  (PROZAC ) 20 MG tablet Take 1.5 tablets (30 mg total) by mouth daily. Patient takes 30 mg daily = 1 and 1/2 tablet. 10/21/14   Aniceto Pfeiffer, PA-C  furosemide   (LASIX ) 40 MG tablet Take 1 tablet (40 mg total) by mouth 2 (two) times daily. 08/03/14   Tonita Fallow, MD  glucose blood test strip 1 each by Other route as needed for other. Check Blood Sugar Once Daily    [provider]  HYDROcodone -acetaminophen  (NORCO) 5-325 MG tablet Take 1 tablet by mouth every 6 (six) hours as needed for severe pain. 05/08/18   Harris, Abigail, PA-C  Magnesium  Oxide 250 MG TABS Take by mouth.    [provider]  phentermine  (ADIPEX-P ) 37.5 MG tablet Take 1 tablet (37.5 mg total) by mouth daily before breakfast. 10/19/14   Aniceto Pfeiffer, PA-C  potassium chloride  SA (KLOR-CON  M20) 20 MEQ tablet Take 1 tablet (20 mEq total) by mouth 4 (four) times daily. 07/19/14   Tonita Fallow, MD    Allergies: Penicillins and Wellbutrin [bupropion]    Review of Systems  Updated Vital Signs BP (!) 157/54   Pulse 65   Temp 97.7 F (36.5 C) (Oral)   Resp 18   Ht 5' 5 (1.651 m)   Wt 84.2 kg   SpO2 95%   BMI 30.89 kg/m   Physical Exam Vitals and nursing note reviewed.  Constitutional:      General: She is not in acute distress.    Appearance: She is not toxic-appearing.  HENT:     Head: Normocephalic.  Nose: Nose normal.  Eyes:     Conjunctiva/sclera: Conjunctivae normal.  Cardiovascular:     Rate and Rhythm: Normal rate and regular rhythm.  Pulmonary:     Effort: Pulmonary effort is normal.     Breath sounds: Normal breath sounds.  Abdominal:     General: Abdomen is flat. There is no distension.     Tenderness: There is no abdominal tenderness. There is no guarding or rebound.  Musculoskeletal:        General: Normal range of motion.     Comments: Tenderness to the right hip and thigh.  Soft compartments.  2+ DP pulses.  Limb warm and well-perfused.  Able to dorsiflex and plantarflex  Skin:    General: Skin is warm and dry.     Capillary Refill: Capillary refill takes less than 2 seconds.  Neurological:     Mental Status: She is alert.   Psychiatric:        Mood and Affect: Mood normal.        Behavior: Behavior normal.     (all labs ordered are listed, but only abnormal results are displayed) Labs Reviewed  CBC  BASIC METABOLIC PANEL WITH GFR    EKG: EKG Interpretation Date/Time:  Tuesday December 10 2023 22:27:24 EDT Ventricular Rate:  62 PR Interval:  184 QRS Duration:  96 QT Interval:  435 QTC Calculation: 442 R Axis:   45  Text Interpretation: Sinus rhythm Ventricular premature complex Low voltage, precordial leads Confirmed by Neysa Clap [phone]) on 12/10/2023 11:19:24 PM  Radiology: [facility] Hip Unilat  With Pelvis 2-3 Views Right Result Date: 12/10/2023 CLINICAL DATA:  Fall injury. EXAM: DG HIP (WITH OR WITHOUT PELVIS) 2-3V RIGHT COMPARISON:  None. FINDINGS: Three views with AP pelvis and AP and cross-table lateral views of the right hip. There is an oblique fracture of the subtrochanteric proximal right femoral shaft with no more than 1 cortex width of medial displacement of the distal fracture fragment. This does appear recent but not sure if this is acute because there is fracture fixation hardware in place. The hardware consists of a long intramedullary femoral nail extending down to the distal diametaphysis, with 2 locking securing screws distally and a fluted locking screw proximally extending through the proximal rod into the neck and head of the femur. There is evidence of at least 1 removed screw in the proximal femoral shaft below the level of the current fracture. The bone mineralization is normal. There is a small comminution fragment along the medial fracture margin, another superimposing inferior to the femoral neck although does not appear to originate from the neck of the right femur. There is no evidence of pelvic fracture or diastasis. Narrowing and spurring is seen in the symphysis but no significant hip joint arthrosis. The SI joints are unremarkable, as visualized. IMPRESSION: 1. Oblique fracture of  the subtrochanteric proximal right femoral shaft with no more than 1 cortex width of medial displacement of the distal fracture fragment. This does appear recent but not sure if this is acute because there is fracture fixation hardware in place, and there are no prior similar studies for comparison. 2. No evidence of pelvic fracture or diastasis. Electronically Signed   By: Francis Quam M.D.   On: 12/10/2023 21:57     Procedures   Medications Ordered in the ED  acetaminophen  (TYLENOL ) tablet 1,000 mg (has no administration in time range)    Clinical Course as of 12/10/23 2321  Tue Dec 10, 2023  2312 Appears to have had right hip replacement surgery in march at [facility]. [TY]    Clinical Course User Index [TY] Neysa Caron PARAS, DO                                 Medical Decision Making This is an 78 year old female presenting emergency department with right hip pain after a fall.  Afebrile nontachycardic, hypertensive.  Is tender to palpation, he does have some internal rotation.  X-ray with questionable fracture. Basic labs reassuring.  CT pelvis pending to evaluate for fracture pending.  Care signed out to overnight team  Amount and/or Complexity of Data Reviewed Independent Historian:     Details: Spouse notes prior hip replacement in [facility] External Data Reviewed:     Details: See ED course Labs: ordered. Decision-making details documented in ED Course. Radiology: ordered.    Details: Considered CT head, however not on blood thinners did not her head.  Nonlocalizing neuro exam.  Low suspicion for acute intracranial pathology ECG/medicine tests: ordered.  Risk OTC drugs. Decision regarding hospitalization. Diagnosis or treatment significantly limited by social determinants of health. Risk Details: Poor health literacy      Final diagnoses:  None    ED Discharge Orders     None          Neysa Caron PARAS, DO 12/10/23 2321

## 2023-12-10 NOTE — ED Triage Notes (Signed)
 Pt BIBA from home after a mechanical fall from her rollator. Pt states that she just slid out of it onto the floor. Denies any LOC, hitting her head, or on any blood thinners. Recent Rt hip surgery x66mos ago and now just has more increased pain than usual in Rt hip.

## 2023-12-11 ENCOUNTER — Emergency Department (HOSPITAL_COMMUNITY)

## 2023-12-11 ENCOUNTER — Encounter (HOSPITAL_COMMUNITY): Payer: Self-pay | Admitting: Internal Medicine

## 2023-12-11 DIAGNOSIS — Z6830 Body mass index (BMI) 30.0-30.9, adult: Secondary | ICD-10-CM | POA: Diagnosis not present

## 2023-12-11 DIAGNOSIS — M797 Fibromyalgia: Secondary | ICD-10-CM | POA: Diagnosis present

## 2023-12-11 DIAGNOSIS — G894 Chronic pain syndrome: Secondary | ICD-10-CM | POA: Diagnosis present

## 2023-12-11 DIAGNOSIS — I959 Hypotension, unspecified: Secondary | ICD-10-CM | POA: Diagnosis not present

## 2023-12-11 DIAGNOSIS — Y792 Prosthetic and other implants, materials and accessory orthopedic devices associated with adverse incidents: Secondary | ICD-10-CM | POA: Diagnosis present

## 2023-12-11 DIAGNOSIS — F32A Depression, unspecified: Secondary | ICD-10-CM | POA: Diagnosis present

## 2023-12-11 DIAGNOSIS — R7303 Prediabetes: Secondary | ICD-10-CM | POA: Diagnosis present

## 2023-12-11 DIAGNOSIS — S72001A Fracture of unspecified part of neck of right femur, initial encounter for closed fracture: Secondary | ICD-10-CM | POA: Diagnosis not present

## 2023-12-11 DIAGNOSIS — F411 Generalized anxiety disorder: Secondary | ICD-10-CM | POA: Diagnosis present

## 2023-12-11 DIAGNOSIS — E66811 Obesity, class 1: Secondary | ICD-10-CM | POA: Diagnosis present

## 2023-12-11 DIAGNOSIS — S79811A Other specified injuries of right hip, initial encounter: Secondary | ICD-10-CM | POA: Diagnosis present

## 2023-12-11 DIAGNOSIS — D62 Acute posthemorrhagic anemia: Secondary | ICD-10-CM | POA: Diagnosis not present

## 2023-12-11 DIAGNOSIS — Z888 Allergy status to other drugs, medicaments and biological substances status: Secondary | ICD-10-CM | POA: Diagnosis not present

## 2023-12-11 DIAGNOSIS — K219 Gastro-esophageal reflux disease without esophagitis: Secondary | ICD-10-CM | POA: Diagnosis present

## 2023-12-11 DIAGNOSIS — Z880 Allergy status to penicillin: Secondary | ICD-10-CM | POA: Diagnosis not present

## 2023-12-11 DIAGNOSIS — I10 Essential (primary) hypertension: Secondary | ICD-10-CM | POA: Diagnosis present

## 2023-12-11 DIAGNOSIS — S7291XA Unspecified fracture of right femur, initial encounter for closed fracture: Secondary | ICD-10-CM | POA: Diagnosis present

## 2023-12-11 DIAGNOSIS — S72001K Fracture of unspecified part of neck of right femur, subsequent encounter for closed fracture with nonunion: Secondary | ICD-10-CM | POA: Diagnosis not present

## 2023-12-11 DIAGNOSIS — E785 Hyperlipidemia, unspecified: Secondary | ICD-10-CM | POA: Diagnosis present

## 2023-12-11 DIAGNOSIS — Z87891 Personal history of nicotine dependence: Secondary | ICD-10-CM | POA: Diagnosis not present

## 2023-12-11 DIAGNOSIS — M25551 Pain in right hip: Secondary | ICD-10-CM | POA: Diagnosis not present

## 2023-12-11 DIAGNOSIS — Z7982 Long term (current) use of aspirin: Secondary | ICD-10-CM | POA: Diagnosis not present

## 2023-12-11 DIAGNOSIS — N182 Chronic kidney disease, stage 2 (mild): Secondary | ICD-10-CM | POA: Diagnosis not present

## 2023-12-11 DIAGNOSIS — Z79899 Other long term (current) drug therapy: Secondary | ICD-10-CM | POA: Diagnosis not present

## 2023-12-11 DIAGNOSIS — M25572 Pain in left ankle and joints of left foot: Secondary | ICD-10-CM | POA: Diagnosis not present

## 2023-12-11 DIAGNOSIS — I129 Hypertensive chronic kidney disease with stage 1 through stage 4 chronic kidney disease, or unspecified chronic kidney disease: Secondary | ICD-10-CM | POA: Diagnosis not present

## 2023-12-11 DIAGNOSIS — F0393 Unspecified dementia, unspecified severity, with mood disturbance: Secondary | ICD-10-CM | POA: Diagnosis present

## 2023-12-11 DIAGNOSIS — Z833 Family history of diabetes mellitus: Secondary | ICD-10-CM | POA: Diagnosis not present

## 2023-12-11 DIAGNOSIS — Z90710 Acquired absence of both cervix and uterus: Secondary | ICD-10-CM | POA: Diagnosis not present

## 2023-12-11 DIAGNOSIS — F419 Anxiety disorder, unspecified: Secondary | ICD-10-CM

## 2023-12-11 DIAGNOSIS — T84114A Breakdown (mechanical) of internal fixation device of right femur, initial encounter: Secondary | ICD-10-CM | POA: Diagnosis present

## 2023-12-11 DIAGNOSIS — Z7983 Long term (current) use of bisphosphonates: Secondary | ICD-10-CM | POA: Diagnosis not present

## 2023-12-11 DIAGNOSIS — M80051K Age-related osteoporosis with current pathological fracture, right femur, subsequent encounter for fracture with nonunion: Secondary | ICD-10-CM | POA: Diagnosis not present

## 2023-12-11 DIAGNOSIS — Z743 Need for continuous supervision: Secondary | ICD-10-CM | POA: Diagnosis not present

## 2023-12-11 DIAGNOSIS — W050XXA Fall from non-moving wheelchair, initial encounter: Secondary | ICD-10-CM | POA: Diagnosis present

## 2023-12-11 DIAGNOSIS — F0394 Unspecified dementia, unspecified severity, with anxiety: Secondary | ICD-10-CM | POA: Diagnosis present

## 2023-12-11 DIAGNOSIS — Z8249 Family history of ischemic heart disease and other diseases of the circulatory system: Secondary | ICD-10-CM | POA: Diagnosis not present

## 2023-12-11 DIAGNOSIS — I340 Nonrheumatic mitral (valve) insufficiency: Secondary | ICD-10-CM | POA: Diagnosis present

## 2023-12-11 DIAGNOSIS — S72141A Displaced intertrochanteric fracture of right femur, initial encounter for closed fracture: Secondary | ICD-10-CM | POA: Diagnosis not present

## 2023-12-11 LAB — CBC
HCT: 34.7 % — ABNORMAL LOW (ref 36.0–46.0)
Hemoglobin: 10.7 g/dL — ABNORMAL LOW (ref 12.0–15.0)
MCH: 29.7 pg (ref 26.0–34.0)
MCHC: 30.8 g/dL (ref 30.0–36.0)
MCV: 96.4 fL (ref 80.0–100.0)
Platelets: 223 K/uL (ref 150–400)
RBC: 3.6 MIL/uL — ABNORMAL LOW (ref 3.87–5.11)
RDW: 13.6 % (ref 11.5–15.5)
WBC: 6.2 K/uL (ref 4.0–10.5)
nRBC: 0 % (ref 0.0–0.2)

## 2023-12-11 LAB — BASIC METABOLIC PANEL WITH GFR
Anion gap: 12 (ref 5–15)
Anion gap: 10 (ref 5–15)
BUN: 15 mg/dL (ref 8–23)
BUN: 14 mg/dL (ref 8–23)
CO2: 22 mmol/L (ref 22–32)
CO2: 22 mmol/L (ref 22–32)
Calcium: 9.4 mg/dL (ref 8.9–10.3)
Calcium: 8.7 mg/dL — ABNORMAL LOW (ref 8.9–10.3)
Chloride: 106 mmol/L (ref 98–111)
Chloride: 108 mmol/L (ref 98–111)
Creatinine, Ser: 0.66 mg/dL (ref 0.44–1.00)
Creatinine, Ser: 0.76 mg/dL (ref 0.44–1.00)
GFR, Estimated: >60 mL/min (ref >60)
GFR, Estimated: >60 mL/min (ref >60)
Glucose, Bld: 95 mg/dL (ref 70–99)
Glucose, Bld: 118 mg/dL — ABNORMAL HIGH (ref 70–99)
Potassium: 3.8 mmol/L (ref 3.5–5.1)
Potassium: 3.9 mmol/L (ref 3.5–5.1)
Sodium: 140 mmol/L (ref 135–145)
Sodium: 139 mmol/L (ref 135–145)

## 2023-12-11 LAB — PROTIME-INR
INR: 1.1 (ref 0.8–1.2)
Prothrombin Time: 14.5 s (ref 11.4–15.2)

## 2023-12-11 MED ORDER — MORPHINE SULFATE (PF) 4 MG/ML IV SOLN
4.0000 mg | Freq: Once | INTRAVENOUS | Status: AC
  Administered 2023-12-11: 4 mg via INTRAVENOUS
  Filled 2023-12-11: qty 1

## 2023-12-11 MED ORDER — DEXTROSE IN LACTATED RINGERS 5 % IV SOLN: INTRAVENOUS | Status: AC

## 2023-12-11 MED ORDER — SODIUM CHLORIDE 0.9% FLUSH
3.0000 mL | Freq: Two times a day (BID) | INTRAVENOUS | Status: DC
  Administered 2023-12-11 – 2023-12-15 (×8): 3 mL via INTRAVENOUS

## 2023-12-11 MED ORDER — SODIUM CHLORIDE 0.9% FLUSH
3.0000 mL | Freq: Two times a day (BID) | INTRAVENOUS | Status: DC
  Administered 2023-12-11 – 2023-12-16 (×11): 3 mL via INTRAVENOUS

## 2023-12-11 MED ORDER — HYDROCODONE-ACETAMINOPHEN 5-325 MG PO TABS
1.0000 | ORAL_TABLET | Freq: Four times a day (QID) | ORAL | Status: DC | PRN
  Administered 2023-12-11: 1 via ORAL
  Filled 2023-12-11: qty 1

## 2023-12-11 MED ORDER — DULOXETINE HCL 60 MG PO CPEP
60.0000 mg | ORAL_CAPSULE | Freq: Every evening | ORAL | Status: DC
  Administered 2023-12-11 – 2023-12-16 (×6): 60 mg via ORAL
  Filled 2023-12-11 (×6): qty 1

## 2023-12-11 MED ORDER — ACETAMINOPHEN 325 MG PO TABS: 650.0000 mg | ORAL_TABLET | Freq: Four times a day (QID) | ORAL | Status: DC | PRN

## 2023-12-11 MED ORDER — SODIUM CHLORIDE 0.9 % IV SOLN: 250.0000 mL | INTRAVENOUS | Status: DC | PRN

## 2023-12-11 MED ORDER — ONDANSETRON HCL 4 MG/2ML IJ SOLN
4.0000 mg | Freq: Once | INTRAMUSCULAR | Status: AC
  Administered 2023-12-11: 4 mg via INTRAVENOUS
  Filled 2023-12-11: qty 2

## 2023-12-11 MED ORDER — MUPIROCIN 2 % EX OINT
1.0000 | TOPICAL_OINTMENT | Freq: Two times a day (BID) | CUTANEOUS | Status: AC
  Administered 2023-12-12 – 2023-12-16 (×8): 1 via NASAL
  Filled 2023-12-11 (×2): qty 22

## 2023-12-11 MED ORDER — ACETAMINOPHEN 650 MG RE SUPP
650.0000 mg | Freq: Four times a day (QID) | RECTAL | Status: DC | PRN
Start: 2023-12-11 — End: 2023-12-12

## 2023-12-11 MED ORDER — PREGABALIN 50 MG PO CAPS
50.0000 mg | ORAL_CAPSULE | Freq: Every evening | ORAL | Status: DC
Start: 2023-12-11 — End: 2023-12-11

## 2023-12-11 MED ORDER — MORPHINE SULFATE (PF) 2 MG/ML IV SOLN
2.0000 mg | INTRAVENOUS | Status: DC | PRN
  Administered 2023-12-12: 2 mg via INTRAVENOUS
  Filled 2023-12-11: qty 1

## 2023-12-11 MED ORDER — ONDANSETRON HCL 4 MG/2ML IJ SOLN: 4.0000 mg | Freq: Four times a day (QID) | INTRAMUSCULAR | Status: DC | PRN

## 2023-12-11 MED ORDER — PREGABALIN 25 MG PO CAPS
50.0000 mg | ORAL_CAPSULE | Freq: Every evening | ORAL | Status: DC
  Administered 2023-12-11 – 2023-12-16 (×7): 50 mg via ORAL
  Filled 2023-12-11 (×6): qty 2
  Filled 2023-12-11: qty 1

## 2023-12-11 MED ORDER — ONDANSETRON HCL 4 MG PO TABS
4.0000 mg | ORAL_TABLET | Freq: Four times a day (QID) | ORAL | Status: DC | PRN
Start: 2023-12-11 — End: 2023-12-17

## 2023-12-11 MED ORDER — SODIUM CHLORIDE 0.9% FLUSH: 3.0000 mL | INTRAVENOUS | Status: DC | PRN

## 2023-12-11 MED ORDER — METHOCARBAMOL 1000 MG/10ML IJ SOLN: 500.0000 mg | Freq: Four times a day (QID) | INTRAMUSCULAR | Status: DC | PRN

## 2023-12-11 MED ORDER — HYDROMORPHONE HCL 1 MG/ML IJ SOLN
0.5000 mg | INTRAMUSCULAR | Status: DC | PRN
  Administered 2023-12-11 (×2): 1 mg via INTRAVENOUS
  Filled 2023-12-11 (×2): qty 1

## 2023-12-11 NOTE — Consult Note (Signed)
 Reason for Consult:Right femur nonunion Referring Physician: Chyrl Gaw Time called: [id number] Time at bedside: 0942   [name] is an 78 y.o. female.  HPI: [name] was brought in with progressive right hip pain. She fell and underwent IMN at [facility] in March. She's been slowly progressing but is still on a RW. Her pain has been increasing over the past month or so. Yesterday she slid to the floor and family decided to bring her in for eval. X-rays showed a nonunion and orthopedic surgery was consulted. Due to the complexity of the injury orthopedic trauma consultation was requested. She lives at home with her daughter and uses a RW to ambulate.  Past Medical History:  Diagnosis Date   Depression    GERD (gastroesophageal reflux disease)    Hyperlipidemia    Hypertension    Obesity    Prediabetes    Vitamin D  deficiency     Past Surgical History:  Procedure Laterality Date   ABDOMINAL HYSTERECTOMY     CARPAL TUNNEL RELEASE Bilateral    LASIK     PILONIDAL CYST EXCISION  1978   SKIN CANCER EXCISION      Family History  Problem Relation Age of Onset   Diabetes Mother    Heart disease Mother    Hypertension Mother    Heart disease Father     Social History:  reports that she quit smoking about 25 years ago. She has never used smokeless tobacco. She reports that she does not drink alcohol and does not use drugs.  Allergies:  Allergies  Allergen Reactions   Penicillins     Rash   Wellbutrin [Bupropion]     Medications: I have reviewed the patient's current medications.  Results for orders placed or performed during the hospital encounter of 12/10/23 (from the past 48 hours)  CBC     Status: None   Collection Time: 12/10/23 10:19 PM  Result Value Ref Range   WBC 7.6 4.0 - 10.5 K/uL   RBC 4.08 3.87 - 5.11 MIL/uL   Hemoglobin 12.3 12.0 - 15.0 g/dL   HCT 61.2 63.9 - 53.9 %   MCV 94.9 80.0 - 100.0 fL   MCH 30.1 26.0 - 34.0 pg   MCHC 31.8 30.0 - 36.0 g/dL   RDW 86.4 88.4  - 84.4 %   Platelets 264 150 - 400 K/uL   nRBC 0.0 0.0 - 0.2 %    Comment: Performed at [facility], [address], [place], [place] [address]  Basic metabolic panel with GFR     Status: None   Collection Time: 12/10/23 11:20 PM  Result Value Ref Range   Sodium 140 135 - 145 mmol/L   Potassium 3.8 3.5 - 5.1 mmol/L   Chloride 106 98 - 111 mmol/L   CO2 22 22 - 32 mmol/L   Glucose, Bld 95 70 - 99 mg/dL    Comment: Glucose reference range applies only to samples taken after fasting for at least 8 hours.   BUN 15 8 - 23 mg/dL   Creatinine, Ser 9.33 0.44 - 1.00 mg/dL   Calcium  9.4 8.9 - 10.3 mg/dL   GFR, Estimated >39 >39 mL/min    Comment: (NOTE) Calculated using the CKD-EPI Creatinine Equation (2021)    Anion gap 12 5 - 15    Comment: Performed at [facility], [address] [place], [place] [address]  CBC     Status: Abnormal   Collection Time: 12/11/23  3:26 AM  Result Value Ref Range   WBC 6.2 4.0 - 10.5 K/uL   RBC 3.60 (L) 3.87 - 5.11 MIL/uL   Hemoglobin 10.7 (L) 12.0 - 15.0 g/dL   HCT 65.2 (L) 63.9 - 53.9 %   MCV 96.4 80.0 - 100.0 fL   MCH 29.7 26.0 - 34.0 pg   MCHC 30.8 30.0 - 36.0 g/dL   RDW 86.3 88.4 - 84.4 %   Platelets 223 150 - 400 K/uL   nRBC 0.0 0.0 - 0.2 %    Comment: Performed at [facility], [address], [place], [place] [address]  Basic metabolic panel     Status: Abnormal   Collection Time: 12/11/23  3:26 AM  Result Value Ref Range   Sodium 139 135 - 145 mmol/L   Potassium 3.9 3.5 - 5.1 mmol/L   Chloride 108 98 - 111 mmol/L   CO2 22 22 - 32 mmol/L   Glucose, Bld 118 (H) 70 - 99 mg/dL    Comment: Glucose reference range applies only to samples taken after fasting for at least 8 hours.   BUN 14 8 - 23 mg/dL   Creatinine, Ser 9.23 0.44 - 1.00 mg/dL   Calcium  8.7 (L) 8.9 - 10.3 mg/dL   GFR, Estimated >39 >39 mL/min    Comment: (NOTE) Calculated using the CKD-EPI Creatinine Equation (2021)     Anion gap 10 5 - 15    Comment: Performed at [facility], [address], [place], [place] [address]  Protime-INR     Status: None   Collection Time: 12/11/23  3:26 AM  Result Value Ref Range   Prothrombin Time 14.5 11.4 - 15.2 seconds   INR 1.1 0.8 - 1.2    Comment: (NOTE) INR goal varies based on device and disease states. Performed at [facility], [address] [place], [place] [address]     CT PELVIS WO CONTRAST Result Date: 12/11/2023 CLINICAL DATA:  Pelvic fracture EXAM: CT PELVIS WITHOUT CONTRAST TECHNIQUE: Multidetector CT imaging of the pelvis was performed following the standard protocol without intravenous contrast. RADIATION DOSE REDUCTION: This exam was performed according to the departmental dose-optimization program which includes automated exposure control, adjustment of the mA and/or kV according to patient size and/or use of iterative reconstruction technique. COMPARISON:  Right hip x-ray same day FINDINGS: Urinary Tract:  No abnormality visualized. Bowel:  Unremarkable visualized pelvic bowel loops. Vascular/Lymphatic: No pathologically enlarged lymph nodes. No significant vascular abnormality seen. Reproductive:  No mass or other significant abnormality Other:  None. Musculoskeletal: The bones are diffusely osteopenic. There is a right hip intramedullary nail and hip screw. There is a comminuted intratrochanteric fracture which appears chronic and healing. Additionally there is an acute appearing oblique fracture through the proximal diaphysis surrounding the intramedullary nail with fracture fragments distracted up to 8 mm. There is no dislocation. There is scarring lateral to the right hip. IMPRESSION: 1. Acute appearing oblique fracture through the proximal diaphysis of the right femur surrounding the intramedullary nail. 2. Chronic and healing right intertrochanteric fracture. Electronically Signed   By: Greig Pique M.D.   On:  12/11/2023 01:22   DG Hip Unilat  With Pelvis 2-3 Views Right Result Date: 12/10/2023 CLINICAL DATA:  Fall injury. EXAM: DG HIP (WITH OR WITHOUT PELVIS) 2-3V RIGHT COMPARISON:  None. FINDINGS: Three views with AP pelvis and AP and cross-table lateral views of the right hip. There is an oblique fracture of the subtrochanteric proximal  right femoral shaft with no more than 1 cortex width of medial displacement of the distal fracture fragment. This does appear recent but not sure if this is acute because there is fracture fixation hardware in place. The hardware consists of a long intramedullary femoral nail extending down to the distal diametaphysis, with 2 locking securing screws distally and a fluted locking screw proximally extending through the proximal rod into the neck and head of the femur. There is evidence of at least 1 removed screw in the proximal femoral shaft below the level of the current fracture. The bone mineralization is normal. There is a small comminution fragment along the medial fracture margin, another superimposing inferior to the femoral neck although does not appear to originate from the neck of the right femur. There is no evidence of pelvic fracture or diastasis. Narrowing and spurring is seen in the symphysis but no significant hip joint arthrosis. The SI joints are unremarkable, as visualized. IMPRESSION: 1. Oblique fracture of the subtrochanteric proximal right femoral shaft with no more than 1 cortex width of medial displacement of the distal fracture fragment. This does appear recent but not sure if this is acute because there is fracture fixation hardware in place, and there are no prior similar studies for comparison. 2. No evidence of pelvic fracture or diastasis. Electronically Signed   By: Francis Quam M.D.   On: 12/10/2023 21:57    Review of Systems  HENT:  Negative for ear discharge, ear pain, hearing loss and tinnitus.   Eyes:  Negative for photophobia and pain.   Respiratory:  Negative for cough and shortness of breath.   Cardiovascular:  Negative for chest pain.  Gastrointestinal:  Negative for abdominal pain, nausea and vomiting.  Genitourinary:  Negative for dysuria, flank pain, frequency and urgency.  Musculoskeletal:  Positive for arthralgias (Right hip). Negative for back pain, myalgias and neck pain.  Neurological:  Negative for dizziness and headaches.  Hematological:  Does not bruise/bleed easily.  Psychiatric/Behavioral:  The patient is not nervous/anxious.    Blood pressure 138/69, pulse 67, temperature 98.2 F (36.8 C), resp. rate 19, height 5' 5 (1.651 m), weight 84.2 kg, SpO2 98%. Physical Exam Constitutional:      General: She is not in acute distress.    Appearance: She is well-developed. She is not diaphoretic.  HENT:     Head: Normocephalic and atraumatic.  Eyes:     General: No scleral icterus.       Right eye: No discharge.        Left eye: No discharge.     Conjunctiva/sclera: Conjunctivae normal.  Cardiovascular:     Rate and Rhythm: Normal rate and regular rhythm.  Pulmonary:     Effort: Pulmonary effort is normal. No respiratory distress.  Musculoskeletal:     Cervical back: Normal range of motion.     Comments: RLE No traumatic wounds, ecchymosis, or rash  Mod TTP hip to knee  No knee or ankle effusion  Knee stable to varus/ valgus and anterior/posterior stress  Sens DPN, SPN, TN intact  Motor EHL, ext, flex, evers 5/5  DP 1+, PT 1+, No significant edema  Skin:    General: Skin is warm and dry.  Neurological:     Mental Status: She is alert.  Psychiatric:        Mood and Affect: Mood normal.        Behavior: Behavior normal.     Assessment/Plan: Right femur non-union -- Plan revision IMN tomorrow  with Dr. Kendal at [facility]. Please transfer and keep NPO after MN. Multiple medical problems including right-sided hip fracture in March 2025 fixation, essential hypertension, mitral valve regurgitation, chronic  pain syndrome, generalized anxiety disorder and depression -- per primary service    Courtney DOROTHA Ned, PA-C Orthopedic Surgery [phone] 12/11/2023, 12:19 PM

## 2023-12-11 NOTE — Progress Notes (Addendum)
 Progress Note   Patient: [name] FMW:[id number] DOB: 09-11-1945 DOA: 12/10/2023     0 DOS: the patient was seen and examined on 12/11/2023   Brief hospital course: 78 y.o. female with medical history significant of right-sided hip fracture in March 2025 fixation, essential hypertension, mitral valve regurgitation, chronic pain syndrome, generalized anxiety disorder and depression presented emergency department for evaluation for fall from from the rollator.  Patient reported that she slipped from her wheelchair and landed on the right side left hip.  Denies loss of consciousness and hitting of the head.  Reported that since the fall patient has right-sided hip joint pain which has been getting worse.  Family also reports that she has had increasing hip pain for the past several weeks and that the fall was there reason to bring her to the emergency department but they are worried that the fracture has been ongoing prior to that.  No other falls before today  Patient denies any loss of consciousness, head injury, chest pain, palpitation, abdominal pain and back pain.  No other complaint at this time.    Assessment and Plan: Close right-sided hip fracture Periprosthetic hip fracture -Patient has history of left-sided hip fracture status postrepair in March 2025.  - CT scan showed acute appearing  oblique fracture through the proximal diaphysis of the right femur surrounding the intramedullary nail. Chronic and healing right intertrochanteric fracture. -Plan is to transfer to [name] for orthopedic repair tomorrow.  She currently has regular diet will make n.p.o. at midnight. -She received Dilaudid  for her pain in the emergency department and became notably groggy and hypotensive after this.  I have since switched her pain medicines to morphine  and we can continue Tylenol  for mild pain. - holding robaxin  in light of notable grogginess, can restart if pain worsens.  - Appreciate Ortho  input.  Anemia:  - Normocytic.  Notable drop from baseline of around 12. - Trend in light of recent fall.  No bruising/hematoma noted on exam   Essential hypertension Hyperlipidemia GERD -Patient reported she does not take any medication except Cymbalta  and Lyrica  at home. -Follow-up blood pressure is here.  She was hypotensive to the 90s systolic in the emergency department and we are therefore holding any blood pressure medications currently.   Anxiety and and depression -Continue Cymbalta  60 mg daily   Chronic pain syndrome Fibromyalgia -Continue Lyrica  50 mg daily     Subjective: Patient complaining of right hip pain.  Able to answer questions but medical history questions better answered by daughter at bedside.  Physical Exam: Vitals:   12/11/23 1130 12/11/23 1200 12/11/23 1300 12/11/23 1354  BP: 126/80  111/60 (!) 96/55  Pulse: 77 79 78 69  Resp: 12 10  12   Temp:    97.6 F (36.4 C)  TempSrc:    Oral  SpO2: 98% 97%  92%  Weight:      Height:       Gen:  Alert, cooperative patient who appears stated age in no acute distress.  Vital signs reviewed.  Somewhat groggy appearing but able to interact with me Cardiac:  Regular rate and rhythm without murmur auscultated.  Good S1/S2. Pulm:  Clear to auscultation bilaterally with good air movement.  No wheezes or rales noted.   Abd:  S/ND/NT Ext:  No LE edema.  Neuro: Alert and oriented to person, place (being in the emergency department), and reason she was in the hospital.  No notable focal neurological deficits.  Family  Communication: Daughter and son-in-law present at bedside  Disposition: Status is: Inpatient  Planned Discharge Destination: Home    Time spent: 35 minutes  Author: Reyes VEAR Gaw, MD 12/11/2023 2:47 PM  For on call review [email or web address].

## 2023-12-11 NOTE — H&P (Signed)
 History and Physical    [name] FMW:[id number] DOB: [birth year] DOA: 12/10/2023  PCP: Pcp, No   Patient coming from: Home   Chief Complaint:  Chief Complaint  Patient presents with   Fall   ED TRIAGE note:  Pt BIBA from home after a mechanical fall from her rollator. Pt states that she just slid out of it onto the floor. Denies any LOC, hitting her head, or on any blood thinners. Recent Rt hip surgery x75mos ago and now just has more increased pain than usual in Rt hip.      HPI:  [name] is a 78 y.o. female with medical history significant of right-sided hip fracture in March 2025 fixation, essential hypertension, mitral valve regurgitation, chronic pain syndrome, generalized anxiety disorder and depression presented emergency department for evaluation for fall from from the rollator.  Patient reported that she slipped from her wheelchair and landed on the right side left hip.  Denies loss of consciousness and hitting of the head.  Reported that since the fall patient has right-sided hip joint pain which has been getting worse.  Patient reported that for last few days patient has sprain of the right sided hip joint and not sure she has some ongoing fracture already.  Patient denies any other episodes of fall until today.  Daughter at the bedside reported that the patient was in usual state of health except pain of the right hip joint. Patient denies any loss of consciousness, head injury, chest pain, palpitation, abdominal pain and back pain.  No other complaint at this time.  Verified with patient that at home currently she is mildly taking Cymbalta  and pregabalin .  ED Course:  At presentation to ED patient found borderline hypertensive otherwise hemodynamically stable.  Afebrile. CBC and BMP unremarkable.  CT abdomen pelvis showing: . Acute appearing oblique fracture through the proximal diaphysis of the right femur surrounding the intramedullary nail. 2. Chronic and  healing right intertrochanteric fracture.  In the ED patient received Tylenol  1000 mg, morphine  4 mg and Zofran .  ED physician Dr. Griselda spoke with orthopedist Dr. Barton who will speak trauma orthopedic surgeon to decide fixation of the fracture and recommended admit to [facility].  Hospitalist has been consulted for pain control and management of right periprosthetic femur fracture alongside with orthopedics team.   Significant labs in the ED: Lab Orders         CBC         Basic metabolic panel with GFR         CBC         Basic metabolic panel         Protime-INR       Review of Systems:  Review of Systems  Constitutional:  Negative for chills, fever, malaise/fatigue and weight loss.  HENT:  Positive for hearing loss.   Eyes:  Negative for blurred vision.  Respiratory:  Negative for cough.   Cardiovascular:  Negative for chest pain, palpitations and leg swelling.  Gastrointestinal:  Negative for abdominal pain, heartburn, nausea and vomiting.  Musculoskeletal:  Positive for falls and joint pain.       Right-sided hip joint pain  Neurological:  Negative for dizziness, loss of consciousness and headaches.  Psychiatric/Behavioral:  The patient is not nervous/anxious.     Past Medical History:  Diagnosis Date   Depression    GERD (gastroesophageal reflux disease)    Hyperlipidemia    Hypertension    Obesity  Prediabetes    Vitamin D  deficiency     Past Surgical History:  Procedure Laterality Date   ABDOMINAL HYSTERECTOMY     CARPAL TUNNEL RELEASE Bilateral    LASIK     PILONIDAL CYST EXCISION  1978   SKIN CANCER EXCISION       reports that she quit smoking about 25 years ago. She has never used smokeless tobacco. She reports that she does not drink alcohol and does not use drugs.  Allergies  Allergen Reactions   Penicillins     Rash   Wellbutrin [Bupropion]     Family History  Problem Relation Age of Onset   Diabetes Mother    Heart disease Mother     Hypertension Mother    Heart disease Father     Prior to Admission medications   Medication Sig Start Date End Date Taking? Authorizing Provider  ACCU-CHEK SOFTCLIX LANCETS lancets by Other route. Check Blood Sugar Once Daily    [provider]  alendronate  (FOSAMAX ) 70 MG tablet TAKE 1 TABLET BY MOUTH ONCE WEEKLY **TAKE FULL GLASS OF WATER ON AN EMPTY STOMACH 08/03/14   Tonita Fallow, MD  ALPRAZolam  (XANAX ) 1 MG tablet Take 1 tablet (1 mg total) by mouth 3 (three) times daily as needed. 07/19/14   Tonita Fallow, MD  aspirin 81 MG tablet Take 81 mg by mouth daily.    [provider]  atorvastatin  (LIPITOR) 80 MG tablet Take one tablet nightly for cholesterol 08/02/14   Tonita Fallow, MD  Blood Glucose Calibration (ACCU-CHEK AVIVA) SOLN by In Vitro route.    [provider]  Blood Glucose Monitoring Suppl (ACCU-CHEK AVIVA PLUS) W/DEVICE KIT by Does not apply route. Check Blood Sugar Once Daily    [provider]  Cholecalciferol  (VITAMIN D  PO) Take 2,000 Units by mouth 2 (two) times daily.    [provider]  esomeprazole  (NEXIUM ) 40 MG capsule Take 1 capsule (40 mg total) by mouth daily. 04/08/14 04/08/15  Craig Alan SAUNDERS, PA-C  FLUoxetine  (PROZAC ) 20 MG tablet Take 1.5 tablets (30 mg total) by mouth daily. Patient takes 30 mg daily = 1 and 1/2 tablet. 10/21/14   Aniceto Pfeiffer, PA-C  furosemide  (LASIX ) 40 MG tablet Take 1 tablet (40 mg total) by mouth 2 (two) times daily. 08/03/14   Tonita Fallow, MD  glucose blood test strip 1 each by Other route as needed for other. Check Blood Sugar Once Daily    [provider]  HYDROcodone -acetaminophen  (NORCO) 5-325 MG tablet Take 1 tablet by mouth every 6 (six) hours as needed for severe pain. 05/08/18   Harris, Abigail, PA-C  Magnesium  Oxide 250 MG TABS Take by mouth.    [provider]  phentermine  (ADIPEX-P ) 37.5 MG tablet Take 1 tablet (37.5 mg total) by mouth daily before  breakfast. 10/19/14   Aniceto Pfeiffer, PA-C  potassium chloride  SA (KLOR-CON  M20) 20 MEQ tablet Take 1 tablet (20 mEq total) by mouth 4 (four) times daily. 07/19/14   Tonita Fallow, MD     Physical Exam: Vitals:   12/11/23 0000 12/11/23 0124 12/11/23 0505 12/11/23 0506  BP: (!) 146/50  137/67   Pulse: 84     Resp: 16   20  Temp:  (!) 97.4 F (36.3 C)    TempSrc:  Oral    SpO2: 94%     Weight:      Height:        Physical Exam Vitals and nursing note reviewed.  Constitutional:  General: She is not in acute distress.    Appearance: She is not ill-appearing.  HENT:     Mouth/Throat:     Mouth: Mucous membranes are moist.  Eyes:     Pupils: Pupils are equal, round, and reactive to light.  Cardiovascular:     Rate and Rhythm: Normal rate and regular rhythm.     Pulses: Normal pulses.     Heart sounds: Normal heart sounds.  Pulmonary:     Effort: Pulmonary effort is normal.     Breath sounds: Normal breath sounds.  Abdominal:     Palpations: Abdomen is soft.  Musculoskeletal:     Cervical back: Normal range of motion and neck supple.     Right lower leg: No edema.     Left lower leg: No edema.     Comments: Left-sided hip joint pain on palpation.  Limited active and passive movement due to pain. Bilateral lower extremity distal pulse dorsalis pedis 2+  Skin:    General: Skin is warm and dry.     Capillary Refill: Capillary refill takes less than 2 seconds.  Neurological:     Mental Status: She is alert and oriented to person, place, and time.  Psychiatric:        Mood and Affect: Mood normal.      Labs on Admission: I have personally reviewed following labs and imaging studies  CBC: Recent Labs  Lab 12/10/23 2219  WBC 7.6  HGB 12.3  HCT 38.7  MCV 94.9  PLT 264   Basic Metabolic Panel: Recent Labs  Lab 12/10/23 2320  NA 140  K 3.8  CL 106  CO2 22  GLUCOSE 95  BUN 15  CREATININE 0.66  CALCIUM  9.4   GFR: Estimated Creatinine Clearance: 60.1  mL/min (by C-G formula based on SCr of 0.66 mg/dL). Liver Function Tests: No results for input(s): AST, ALT, ALKPHOS, BILITOT, PROT, ALBUMIN in the last 168 hours. No results for input(s): LIPASE, AMYLASE in the last 168 hours. No results for input(s): AMMONIA in the last 168 hours. Coagulation Profile: No results for input(s): INR, PROTIME in the last 168 hours. Cardiac Enzymes: No results for input(s): CKTOTAL, CKMB, CKMBINDEX, TROPONINI, TROPONINIHS in the last 168 hours. BNP (last 3 results) No results for input(s): BNP in the last 8760 hours. HbA1C: No results for input(s): HGBA1C in the last 72 hours. CBG: No results for input(s): GLUCAP in the last 168 hours. Lipid Profile: No results for input(s): CHOL, HDL, LDLCALC, TRIG, CHOLHDL, LDLDIRECT in the last 72 hours. Thyroid Function Tests: No results for input(s): TSH, T4TOTAL, FREET4, T3FREE, THYROIDAB in the last 72 hours. Anemia Panel: No results for input(s): VITAMINB12, FOLATE, FERRITIN, TIBC, IRON, RETICCTPCT in the last 72 hours. Urine analysis:    Component Value Date/Time   COLORURINE YELLOW 10/19/2014 1654   APPEARANCEUR CLEAR 10/19/2014 1654   LABSPEC 1.016 10/19/2014 1654   PHURINE 6.5 10/19/2014 1654   GLUCOSEU NEG 10/19/2014 1654   HGBUR NEG 10/19/2014 1654   BILIRUBINUR NEG 10/19/2014 1654   KETONESUR NEG 10/19/2014 1654   PROTEINUR NEG 10/19/2014 1654   UROBILINOGEN 0.2 10/19/2014 1654   NITRITE NEG 10/19/2014 1654   LEUKOCYTESUR SMALL (A) 10/19/2014 1654    Radiological Exams on Admission: I have personally reviewed images CT PELVIS WO CONTRAST Result Date: 12/11/2023 CLINICAL DATA:  Pelvic fracture EXAM: CT PELVIS WITHOUT CONTRAST TECHNIQUE: Multidetector CT imaging of the pelvis was performed following the standard protocol without intravenous contrast. RADIATION DOSE REDUCTION:  This exam was performed according to the departmental  dose-optimization program which includes automated exposure control, adjustment of the mA and/or kV according to patient size and/or use of iterative reconstruction technique. COMPARISON:  Right hip x-ray same day FINDINGS: Urinary Tract:  No abnormality visualized. Bowel:  Unremarkable visualized pelvic bowel loops. Vascular/Lymphatic: No pathologically enlarged lymph nodes. No significant vascular abnormality seen. Reproductive:  No mass or other significant abnormality Other:  None. Musculoskeletal: The bones are diffusely osteopenic. There is a right hip intramedullary nail and hip screw. There is a comminuted intratrochanteric fracture which appears chronic and healing. Additionally there is an acute appearing oblique fracture through the proximal diaphysis surrounding the intramedullary nail with fracture fragments distracted up to 8 mm. There is no dislocation. There is scarring lateral to the right hip. IMPRESSION: 1. Acute appearing oblique fracture through the proximal diaphysis of the right femur surrounding the intramedullary nail. 2. Chronic and healing right intertrochanteric fracture. Electronically Signed   By: Greig Pique M.D.   On: 12/11/2023 01:22   DG Hip Unilat  With Pelvis 2-3 Views Right Result Date: 12/10/2023 CLINICAL DATA:  Fall injury. EXAM: DG HIP (WITH OR WITHOUT PELVIS) 2-3V RIGHT COMPARISON:  None. FINDINGS: Three views with AP pelvis and AP and cross-table lateral views of the right hip. There is an oblique fracture of the subtrochanteric proximal right femoral shaft with no more than 1 cortex width of medial displacement of the distal fracture fragment. This does appear recent but not sure if this is acute because there is fracture fixation hardware in place. The hardware consists of a long intramedullary femoral nail extending down to the distal diametaphysis, with 2 locking securing screws distally and a fluted locking screw proximally extending through the proximal rod into  the neck and head of the femur. There is evidence of at least 1 removed screw in the proximal femoral shaft below the level of the current fracture. The bone mineralization is normal. There is a small comminution fragment along the medial fracture margin, another superimposing inferior to the femoral neck although does not appear to originate from the neck of the right femur. There is no evidence of pelvic fracture or diastasis. Narrowing and spurring is seen in the symphysis but no significant hip joint arthrosis. The SI joints are unremarkable, as visualized. IMPRESSION: 1. Oblique fracture of the subtrochanteric proximal right femoral shaft with no more than 1 cortex width of medial displacement of the distal fracture fragment. This does appear recent but not sure if this is acute because there is fracture fixation hardware in place, and there are no prior similar studies for comparison. 2. No evidence of pelvic fracture or diastasis. Electronically Signed   By: Francis Quam M.D.   On: 12/10/2023 21:57     EKG: My personal interpretation of EKG shows:Normal sinus rhythm heart rate 62, premature ventricular complex.  Low voltage precordial leads.   Assessment/Plan: Principal Problem:   Closed right hip fracture (HCC) Active Problems:   Essential hypertension   Hyperlipidemia   GERD (gastroesophageal reflux disease)   Anxiety and depression   Chronic pain syndrome   Closed right femoral fracture (HCC)    Assessment and Plan: Close right-sided hip fracture Periprosthetic hip fracture -Patient has history of left-sided hip fracture status postrepair in March 2025.  For last couple of days patient continues to have right-sided hip joint pain.  Patient and her daughter reported that she is clipped of from the rollator to the ground and since  then right-sided hip joint pain has been progressively worse. - At presentation to ED hemodynamically stable.  CBC and BMP unremarkable. - CT scan showed  acute appearing  oblique fracture through the proximal diaphysis of the right femur surrounding the intramedullary nail. Chronic and healing right intertrochanteric fracture. -ED physician discussed with orthopedic surgeon Dr. Barton recommended admit to [facility], keep patient n.p.o. and he will discuss with trauma-ortho-surgeon to decide for surgery and repair of the fracture. In the ED patient received Tylenol  1000 mg and morphine  4 mg. - Continue Tylenol  as needed for mild pain, Norco for moderate pain and Dilaudid  for severe pain control. - Continue Robaxin  and Zofran . - Keep patient NPO. - Continue maintenance fluid D5 LR at 75 cc/h.  Essential hypertension Hyperlipidemia GERD -Patient reported she does not take any medication except Cymbalta  and Lyrica  at home.  Anxiety and and depression -Continue Cymbalta  60 mg daily  Chronic pain syndrome Fibromyalgia -Continue Lyrica  50 mg daily   DVT prophylaxis:  SCDs.  Holding pharmacological DVT prophylaxis until orthopedic intervention. Code Status:  Full Code Diet: NPO.  Continue ice chips and sips with meds. Family Communication:   Family was present at bedside, at the time of interview. Opportunity was given to ask question and all questions were answered satisfactorily.  Disposition Plan: Follow-up with orthopedics plan. Consults: Orthopedic surgery  Admission status:   Inpatient, Telemetry bed  Severity of Illness: The appropriate patient status for this patient is INPATIENT. Inpatient status is judged to be reasonable and necessary in order to provide the required intensity of service to ensure the patient's safety. The patient's presenting symptoms, physical exam findings, and initial radiographic and laboratory data in the context of their chronic comorbidities is felt to place them at high risk for further clinical deterioration. Furthermore, it is not anticipated that the patient will be medically stable for  discharge from the hospital within 2 midnights of admission.   * I certify that at the point of admission it is my clinical judgment that the patient will require inpatient hospital care spanning beyond 2 midnights from the point of admission due to high intensity of service, high risk for further deterioration and high frequency of surveillance required.DEWAINE    Courtney Pinnix, MD [facility]  How to contact the [facility] Attending or Consulting provider 7A - 7P or covering provider during after hours 7P -7A, for this patient.  Check the care team in [facility] and look for a) attending/consulting [facility] provider listed and b) the [facility] team listed Log into [email or web address] and use [place]'s universal password to access. If you do not have the password, please contact the hospital operator. Locate the [facility] provider you are looking for under [facility] and page to a number that you can be directly reached. If you still have difficulty reaching the provider, please page the [facility] (Director on Call) for the Hospitalists listed on amion for assistance.  12/11/2023, 5:06 AM

## 2023-12-11 NOTE — Progress Notes (Signed)
 Patient arrived to unit via EMS she is alert, oriented, room air, and son is at bedside, pt oriented to room, wheels locked, bed in low position, and call bell within reach.

## 2023-12-11 NOTE — ED Provider Notes (Signed)
 Care assumed at midnight. Patient here following a fall. Has right hip pain, care assume pending CT scan.  CT is concerning for acute fracture. Discussed with Dr. Barton with orthopedics - recommends medicine admission to [facility], NPO after midnight. Patient and daughter updated at bedside. Hospitals consulted for admission.   Griselda Norris, MD 12/11/23 [phone]

## 2023-12-11 NOTE — H&P (View-Only) (Signed)
 Reason for Consult:Right femur nonunion Referring Physician: Chyrl Gaw Time called: [id number] Time at bedside: 0942   [name] is an 78 y.o. female.  HPI: [name] was brought in with progressive right hip pain. She fell and underwent IMN at [facility] in March. She's been slowly progressing but is still on a RW. Her pain has been increasing over the past month or so. Yesterday she slid to the floor and family decided to bring her in for eval. X-rays showed a nonunion and orthopedic surgery was consulted. Due to the complexity of the injury orthopedic trauma consultation was requested. She lives at home with her daughter and uses a RW to ambulate.  Past Medical History:  Diagnosis Date   Depression    GERD (gastroesophageal reflux disease)    Hyperlipidemia    Hypertension    Obesity    Prediabetes    Vitamin D  deficiency     Past Surgical History:  Procedure Laterality Date   ABDOMINAL HYSTERECTOMY     CARPAL TUNNEL RELEASE Bilateral    LASIK     PILONIDAL CYST EXCISION  1978   SKIN CANCER EXCISION      Family History  Problem Relation Age of Onset   Diabetes Mother    Heart disease Mother    Hypertension Mother    Heart disease Father     Social History:  reports that she quit smoking about 25 years ago. She has never used smokeless tobacco. She reports that she does not drink alcohol and does not use drugs.  Allergies:  Allergies  Allergen Reactions   Penicillins     Rash   Wellbutrin [Bupropion]     Medications: I have reviewed the patient's current medications.  Results for orders placed or performed during the hospital encounter of 12/10/23 (from the past 48 hours)  CBC     Status: None   Collection Time: 12/10/23 10:19 PM  Result Value Ref Range   WBC 7.6 4.0 - 10.5 K/uL   RBC 4.08 3.87 - 5.11 MIL/uL   Hemoglobin 12.3 12.0 - 15.0 g/dL   HCT 61.2 63.9 - 53.9 %   MCV 94.9 80.0 - 100.0 fL   MCH 30.1 26.0 - 34.0 pg   MCHC 31.8 30.0 - 36.0 g/dL   RDW 86.4 88.4  - 84.4 %   Platelets 264 150 - 400 K/uL   nRBC 0.0 0.0 - 0.2 %    Comment: Performed at [facility], [address], [place], [place] [address]  Basic metabolic panel with GFR     Status: None   Collection Time: 12/10/23 11:20 PM  Result Value Ref Range   Sodium 140 135 - 145 mmol/L   Potassium 3.8 3.5 - 5.1 mmol/L   Chloride 106 98 - 111 mmol/L   CO2 22 22 - 32 mmol/L   Glucose, Bld 95 70 - 99 mg/dL    Comment: Glucose reference range applies only to samples taken after fasting for at least 8 hours.   BUN 15 8 - 23 mg/dL   Creatinine, Ser 9.33 0.44 - 1.00 mg/dL   Calcium  9.4 8.9 - 10.3 mg/dL   GFR, Estimated >39 >39 mL/min    Comment: (NOTE) Calculated using the CKD-EPI Creatinine Equation (2021)    Anion gap 12 5 - 15    Comment: Performed at [facility], [address] [place], [place] [address]  CBC     Status: Abnormal   Collection Time: 12/11/23  3:26 AM  Result Value Ref Range   WBC 6.2 4.0 - 10.5 K/uL   RBC 3.60 (L) 3.87 - 5.11 MIL/uL   Hemoglobin 10.7 (L) 12.0 - 15.0 g/dL   HCT 65.2 (L) 63.9 - 53.9 %   MCV 96.4 80.0 - 100.0 fL   MCH 29.7 26.0 - 34.0 pg   MCHC 30.8 30.0 - 36.0 g/dL   RDW 86.3 88.4 - 84.4 %   Platelets 223 150 - 400 K/uL   nRBC 0.0 0.0 - 0.2 %    Comment: Performed at [facility], [address], [place], [place] [address]  Basic metabolic panel     Status: Abnormal   Collection Time: 12/11/23  3:26 AM  Result Value Ref Range   Sodium 139 135 - 145 mmol/L   Potassium 3.9 3.5 - 5.1 mmol/L   Chloride 108 98 - 111 mmol/L   CO2 22 22 - 32 mmol/L   Glucose, Bld 118 (H) 70 - 99 mg/dL    Comment: Glucose reference range applies only to samples taken after fasting for at least 8 hours.   BUN 14 8 - 23 mg/dL   Creatinine, Ser 9.23 0.44 - 1.00 mg/dL   Calcium  8.7 (L) 8.9 - 10.3 mg/dL   GFR, Estimated >39 >39 mL/min    Comment: (NOTE) Calculated using the CKD-EPI Creatinine Equation (2021)     Anion gap 10 5 - 15    Comment: Performed at [facility], [address], [place], [place] [address]  Protime-INR     Status: None   Collection Time: 12/11/23  3:26 AM  Result Value Ref Range   Prothrombin Time 14.5 11.4 - 15.2 seconds   INR 1.1 0.8 - 1.2    Comment: (NOTE) INR goal varies based on device and disease states. Performed at [facility], [address] [place], [place] [address]     CT PELVIS WO CONTRAST Result Date: 12/11/2023 CLINICAL DATA:  Pelvic fracture EXAM: CT PELVIS WITHOUT CONTRAST TECHNIQUE: Multidetector CT imaging of the pelvis was performed following the standard protocol without intravenous contrast. RADIATION DOSE REDUCTION: This exam was performed according to the departmental dose-optimization program which includes automated exposure control, adjustment of the mA and/or kV according to patient size and/or use of iterative reconstruction technique. COMPARISON:  Right hip x-ray same day FINDINGS: Urinary Tract:  No abnormality visualized. Bowel:  Unremarkable visualized pelvic bowel loops. Vascular/Lymphatic: No pathologically enlarged lymph nodes. No significant vascular abnormality seen. Reproductive:  No mass or other significant abnormality Other:  None. Musculoskeletal: The bones are diffusely osteopenic. There is a right hip intramedullary nail and hip screw. There is a comminuted intratrochanteric fracture which appears chronic and healing. Additionally there is an acute appearing oblique fracture through the proximal diaphysis surrounding the intramedullary nail with fracture fragments distracted up to 8 mm. There is no dislocation. There is scarring lateral to the right hip. IMPRESSION: 1. Acute appearing oblique fracture through the proximal diaphysis of the right femur surrounding the intramedullary nail. 2. Chronic and healing right intertrochanteric fracture. Electronically Signed   By: Greig Pique M.D.   On:  12/11/2023 01:22   DG Hip Unilat  With Pelvis 2-3 Views Right Result Date: 12/10/2023 CLINICAL DATA:  Fall injury. EXAM: DG HIP (WITH OR WITHOUT PELVIS) 2-3V RIGHT COMPARISON:  None. FINDINGS: Three views with AP pelvis and AP and cross-table lateral views of the right hip. There is an oblique fracture of the subtrochanteric proximal  right femoral shaft with no more than 1 cortex width of medial displacement of the distal fracture fragment. This does appear recent but not sure if this is acute because there is fracture fixation hardware in place. The hardware consists of a long intramedullary femoral nail extending down to the distal diametaphysis, with 2 locking securing screws distally and a fluted locking screw proximally extending through the proximal rod into the neck and head of the femur. There is evidence of at least 1 removed screw in the proximal femoral shaft below the level of the current fracture. The bone mineralization is normal. There is a small comminution fragment along the medial fracture margin, another superimposing inferior to the femoral neck although does not appear to originate from the neck of the right femur. There is no evidence of pelvic fracture or diastasis. Narrowing and spurring is seen in the symphysis but no significant hip joint arthrosis. The SI joints are unremarkable, as visualized. IMPRESSION: 1. Oblique fracture of the subtrochanteric proximal right femoral shaft with no more than 1 cortex width of medial displacement of the distal fracture fragment. This does appear recent but not sure if this is acute because there is fracture fixation hardware in place, and there are no prior similar studies for comparison. 2. No evidence of pelvic fracture or diastasis. Electronically Signed   By: Francis Quam M.D.   On: 12/10/2023 21:57    Review of Systems  HENT:  Negative for ear discharge, ear pain, hearing loss and tinnitus.   Eyes:  Negative for photophobia and pain.   Respiratory:  Negative for cough and shortness of breath.   Cardiovascular:  Negative for chest pain.  Gastrointestinal:  Negative for abdominal pain, nausea and vomiting.  Genitourinary:  Negative for dysuria, flank pain, frequency and urgency.  Musculoskeletal:  Positive for arthralgias (Right hip). Negative for back pain, myalgias and neck pain.  Neurological:  Negative for dizziness and headaches.  Hematological:  Does not bruise/bleed easily.  Psychiatric/Behavioral:  The patient is not nervous/anxious.    Blood pressure 138/69, pulse 67, temperature 98.2 F (36.8 C), resp. rate 19, height 5' 5 (1.651 m), weight 84.2 kg, SpO2 98%. Physical Exam Constitutional:      General: She is not in acute distress.    Appearance: She is well-developed. She is not diaphoretic.  HENT:     Head: Normocephalic and atraumatic.  Eyes:     General: No scleral icterus.       Right eye: No discharge.        Left eye: No discharge.     Conjunctiva/sclera: Conjunctivae normal.  Cardiovascular:     Rate and Rhythm: Normal rate and regular rhythm.  Pulmonary:     Effort: Pulmonary effort is normal. No respiratory distress.  Musculoskeletal:     Cervical back: Normal range of motion.     Comments: RLE No traumatic wounds, ecchymosis, or rash  Mod TTP hip to knee  No knee or ankle effusion  Knee stable to varus/ valgus and anterior/posterior stress  Sens DPN, SPN, TN intact  Motor EHL, ext, flex, evers 5/5  DP 1+, PT 1+, No significant edema  Skin:    General: Skin is warm and dry.  Neurological:     Mental Status: She is alert.  Psychiatric:        Mood and Affect: Mood normal.        Behavior: Behavior normal.     Assessment/Plan: Right femur non-union -- Plan revision IMN tomorrow  with Dr. Kendal at [facility]. Please transfer and keep NPO after MN. Multiple medical problems including right-sided hip fracture in March 2025 fixation, essential hypertension, mitral valve regurgitation, chronic  pain syndrome, generalized anxiety disorder and depression -- per primary service    Ozell DOROTHA Ned, PA-C Orthopedic Surgery [phone] 12/11/2023, 12:19 PM

## 2023-12-12 ENCOUNTER — Inpatient Hospital Stay (HOSPITAL_COMMUNITY)

## 2023-12-12 ENCOUNTER — Encounter (HOSPITAL_COMMUNITY)
Admission: EM | Disposition: A | Discharge status: Skilled Nursing Facility | Payer: Self-pay | Source: Home / Self Care | Attending: Internal Medicine

## 2023-12-12 ENCOUNTER — Inpatient Hospital Stay (HOSPITAL_COMMUNITY): Admitting: Anesthesiology

## 2023-12-12 ENCOUNTER — Encounter (HOSPITAL_COMMUNITY): Payer: Self-pay | Admitting: Internal Medicine

## 2023-12-12 ENCOUNTER — Other Ambulatory Visit: Payer: Self-pay

## 2023-12-12 DIAGNOSIS — I129 Hypertensive chronic kidney disease with stage 1 through stage 4 chronic kidney disease, or unspecified chronic kidney disease: Secondary | ICD-10-CM | POA: Diagnosis not present

## 2023-12-12 DIAGNOSIS — N182 Chronic kidney disease, stage 2 (mild): Secondary | ICD-10-CM

## 2023-12-12 DIAGNOSIS — Z87891 Personal history of nicotine dependence: Secondary | ICD-10-CM | POA: Diagnosis not present

## 2023-12-12 DIAGNOSIS — S72001K Fracture of unspecified part of neck of right femur, subsequent encounter for closed fracture with nonunion: Secondary | ICD-10-CM | POA: Diagnosis not present

## 2023-12-12 DIAGNOSIS — S72001A Fracture of unspecified part of neck of right femur, initial encounter for closed fracture: Secondary | ICD-10-CM | POA: Diagnosis not present

## 2023-12-12 HISTORY — PX: FEMUR IM NAIL: SHX1597

## 2023-12-12 HISTORY — PX: HARDWARE REMOVAL: SHX979

## 2023-12-12 LAB — CBC
HCT: 34.5 % — ABNORMAL LOW (ref 36.0–46.0)
Hemoglobin: 10.9 g/dL — ABNORMAL LOW (ref 12.0–15.0)
MCH: 30.7 pg (ref 26.0–34.0)
MCHC: 31.6 g/dL (ref 30.0–36.0)
MCV: 97.2 fL (ref 80.0–100.0)
Platelets: 220 K/uL (ref 150–400)
RBC: 3.55 MIL/uL — ABNORMAL LOW (ref 3.87–5.11)
RDW: 14 % (ref 11.5–15.5)
WBC: 6.7 K/uL (ref 4.0–10.5)
nRBC: 0 % (ref 0.0–0.2)

## 2023-12-12 LAB — BASIC METABOLIC PANEL WITH GFR
Anion gap: 9 (ref 5–15)
BUN: 16 mg/dL (ref 8–23)
CO2: 24 mmol/L (ref 22–32)
Calcium: 9.3 mg/dL (ref 8.9–10.3)
Chloride: 106 mmol/L (ref 98–111)
Creatinine, Ser: 1.13 mg/dL — ABNORMAL HIGH (ref 0.44–1.00)
GFR, Estimated: 49 mL/min — ABNORMAL LOW (ref >60)
Glucose, Bld: 113 mg/dL — ABNORMAL HIGH (ref 70–99)
Potassium: 4.6 mmol/L (ref 3.5–5.1)
Sodium: 139 mmol/L (ref 135–145)

## 2023-12-12 LAB — SURGICAL PCR SCREEN
MRSA, PCR: NEGATIVE
Staphylococcus aureus: NEGATIVE

## 2023-12-12 LAB — VITAMIN D 25 HYDROXY (VIT D DEFICIENCY, FRACTURES): Vit D, 25-Hydroxy: 30.93 ng/mL (ref 30–100)

## 2023-12-12 SURGERY — INSERTION, INTRAMEDULLARY ROD, FEMUR
Anesthesia: General | Laterality: Right

## 2023-12-12 MED ORDER — 0.9 % SODIUM CHLORIDE (POUR BTL) OPTIME
TOPICAL | Status: DC | PRN
  Administered 2023-12-12: 1000 mL

## 2023-12-12 MED ORDER — ACETAMINOPHEN 325 MG PO TABS
650.0000 mg | ORAL_TABLET | Freq: Four times a day (QID) | ORAL | Status: DC
  Administered 2023-12-12 – 2023-12-17 (×20): 650 mg via ORAL
  Filled 2023-12-12 (×19): qty 2

## 2023-12-12 MED ORDER — FENTANYL CITRATE (PF) 250 MCG/5ML IJ SOLN
INTRAMUSCULAR | Status: AC
  Filled 2023-12-12: qty 5

## 2023-12-12 MED ORDER — CEFAZOLIN SODIUM-DEXTROSE 2-4 GM/100ML-% IV SOLN
2.0000 g | INTRAVENOUS | Status: AC
  Administered 2023-12-12: 2 g via INTRAVENOUS

## 2023-12-12 MED ORDER — ONDANSETRON HCL 4 MG/2ML IJ SOLN
INTRAMUSCULAR | Status: DC | PRN
  Administered 2023-12-12: 4 mg via INTRAVENOUS

## 2023-12-12 MED ORDER — POLYETHYLENE GLYCOL 3350 17 G PO PACK
17.0000 g | PACK | Freq: Every day | ORAL | Status: DC | PRN
Start: 2023-12-12 — End: 2023-12-17

## 2023-12-12 MED ORDER — CHLORHEXIDINE GLUCONATE 0.12 % MT SOLN: 15.0000 mL | Freq: Once | OROMUCOSAL | Status: AC

## 2023-12-12 MED ORDER — FENTANYL CITRATE (PF) 250 MCG/5ML IJ SOLN
INTRAMUSCULAR | Status: DC | PRN
  Administered 2023-12-12: 50 ug via INTRAVENOUS
  Administered 2023-12-12: 100 ug via INTRAVENOUS

## 2023-12-12 MED ORDER — TRANEXAMIC ACID-NACL 1000-0.7 MG/100ML-% IV SOLN
INTRAVENOUS | Status: AC
  Filled 2023-12-12: qty 100

## 2023-12-12 MED ORDER — MORPHINE SULFATE (PF) 2 MG/ML IV SOLN
0.5000 mg | INTRAVENOUS | Status: DC | PRN
  Administered 2023-12-12 – 2023-12-13 (×2): 1 mg via INTRAVENOUS
  Filled 2023-12-12 (×3): qty 1

## 2023-12-12 MED ORDER — TRANEXAMIC ACID-NACL 1000-0.7 MG/100ML-% IV SOLN
1000.0000 mg | Freq: Once | INTRAVENOUS | Status: AC
  Administered 2023-12-12: 1000 mg via INTRAVENOUS
  Filled 2023-12-12: qty 100

## 2023-12-12 MED ORDER — CEFAZOLIN SODIUM-DEXTROSE 2-4 GM/100ML-% IV SOLN
INTRAVENOUS | Status: AC
  Filled 2023-12-12: qty 100

## 2023-12-12 MED ORDER — ACETAMINOPHEN 500 MG PO TABS
1000.0000 mg | ORAL_TABLET | Freq: Once | ORAL | Status: AC
  Administered 2023-12-12: 1000 mg via ORAL
  Filled 2023-12-12: qty 2

## 2023-12-12 MED ORDER — PROPOFOL 10 MG/ML IV BOLUS
INTRAVENOUS | Status: AC
  Filled 2023-12-12: qty 20

## 2023-12-12 MED ORDER — DOCUSATE SODIUM 100 MG PO CAPS
100.0000 mg | ORAL_CAPSULE | Freq: Two times a day (BID) | ORAL | Status: DC
  Administered 2023-12-12 – 2023-12-17 (×11): 100 mg via ORAL
  Filled 2023-12-12 (×11): qty 1

## 2023-12-12 MED ORDER — LIDOCAINE 2% (20 MG/ML) 5 ML SYRINGE
INTRAMUSCULAR | Status: AC
  Filled 2023-12-12: qty 10

## 2023-12-12 MED ORDER — METHOCARBAMOL 500 MG PO TABS
500.0000 mg | ORAL_TABLET | Freq: Four times a day (QID) | ORAL | Status: DC | PRN
  Administered 2023-12-13 – 2023-12-16 (×7): 500 mg via ORAL
  Filled 2023-12-12 (×6): qty 1

## 2023-12-12 MED ORDER — VANCOMYCIN HCL 1000 MG IV SOLR
INTRAVENOUS | Status: AC
  Filled 2023-12-12: qty 20

## 2023-12-12 MED ORDER — PROPOFOL 10 MG/ML IV BOLUS
INTRAVENOUS | Status: DC | PRN
  Administered 2023-12-12: 110 mg via INTRAVENOUS

## 2023-12-12 MED ORDER — DEXAMETHASONE SODIUM PHOSPHATE 10 MG/ML IJ SOLN
INTRAMUSCULAR | Status: AC
  Filled 2023-12-12: qty 2

## 2023-12-12 MED ORDER — CEFAZOLIN SODIUM-DEXTROSE 2-4 GM/100ML-% IV SOLN
2.0000 g | Freq: Three times a day (TID) | INTRAVENOUS | Status: AC
  Administered 2023-12-12 – 2023-12-13 (×3): 2 g via INTRAVENOUS
  Filled 2023-12-12 (×3): qty 100

## 2023-12-12 MED ORDER — LIDOCAINE 2% (20 MG/ML) 5 ML SYRINGE
INTRAMUSCULAR | Status: DC | PRN
  Administered 2023-12-12: 60 mg via INTRAVENOUS

## 2023-12-12 MED ORDER — FENTANYL CITRATE (PF) 100 MCG/2ML IJ SOLN
INTRAMUSCULAR | Status: AC
  Filled 2023-12-12: qty 2

## 2023-12-12 MED ORDER — ONDANSETRON HCL 4 MG/2ML IJ SOLN: 4.0000 mg | Freq: Once | INTRAMUSCULAR | Status: DC | PRN

## 2023-12-12 MED ORDER — DEXAMETHASONE SODIUM PHOSPHATE 10 MG/ML IJ SOLN
INTRAMUSCULAR | Status: DC | PRN
  Administered 2023-12-12: 8 mg via INTRAVENOUS

## 2023-12-12 MED ORDER — FENTANYL CITRATE (PF) 100 MCG/2ML IJ SOLN
25.0000 ug | INTRAMUSCULAR | Status: DC | PRN
  Administered 2023-12-12: 25 ug via INTRAVENOUS

## 2023-12-12 MED ORDER — OXYCODONE HCL 5 MG/5ML PO SOLN: 5.0000 mg | Freq: Once | ORAL | Status: DC | PRN

## 2023-12-12 MED ORDER — OXYCODONE HCL 5 MG PO TABS: 5.0000 mg | ORAL_TABLET | Freq: Once | ORAL | Status: DC | PRN

## 2023-12-12 MED ORDER — ROCURONIUM BROMIDE 10 MG/ML (PF) SYRINGE
PREFILLED_SYRINGE | INTRAVENOUS | Status: DC | PRN
  Administered 2023-12-12: 10 mg via INTRAVENOUS
  Administered 2023-12-12: 50 mg via INTRAVENOUS

## 2023-12-12 MED ORDER — PHENYLEPHRINE 80 MCG/ML (10ML) SYRINGE FOR IV PUSH (FOR BLOOD PRESSURE SUPPORT)
PREFILLED_SYRINGE | INTRAVENOUS | Status: DC | PRN
  Administered 2023-12-12: 160 ug via INTRAVENOUS
  Administered 2023-12-12: 40 ug via INTRAVENOUS

## 2023-12-12 MED ORDER — PHENYLEPHRINE HCL-NACL 20-0.9 MG/250ML-% IV SOLN
INTRAVENOUS | Status: DC | PRN
  Administered 2023-12-12: 50 ug/min via INTRAVENOUS

## 2023-12-12 MED ORDER — SODIUM CHLORIDE 0.9 % IV SOLN: INTRAVENOUS | Status: AC

## 2023-12-12 MED ORDER — EPHEDRINE 5 MG/ML INJ
INTRAVENOUS | Status: AC
  Filled 2023-12-12: qty 5

## 2023-12-12 MED ORDER — PHENYLEPHRINE 80 MCG/ML (10ML) SYRINGE FOR IV PUSH (FOR BLOOD PRESSURE SUPPORT)
PREFILLED_SYRINGE | INTRAVENOUS | Status: AC
  Filled 2023-12-12: qty 20

## 2023-12-12 MED ORDER — SUGAMMADEX SODIUM 200 MG/2ML IV SOLN
INTRAVENOUS | Status: DC | PRN
  Administered 2023-12-12: 100 mg via INTRAVENOUS

## 2023-12-12 MED ORDER — CHLORHEXIDINE GLUCONATE 0.12 % MT SOLN
OROMUCOSAL | Status: AC
  Administered 2023-12-12: 15 mL via OROMUCOSAL
  Filled 2023-12-12: qty 15

## 2023-12-12 MED ORDER — METOCLOPRAMIDE HCL 5 MG/ML IJ SOLN: 5.0000 mg | Freq: Three times a day (TID) | INTRAMUSCULAR | Status: DC | PRN

## 2023-12-12 MED ORDER — ONDANSETRON HCL 4 MG/2ML IJ SOLN
INTRAMUSCULAR | Status: AC
Start: 2023-12-12 — End: 2023-12-12
  Filled 2023-12-12: qty 4

## 2023-12-12 MED ORDER — CHLORHEXIDINE GLUCONATE 4 % EX SOLN: 60.0000 mL | Freq: Once | CUTANEOUS | Status: DC

## 2023-12-12 MED ORDER — ENOXAPARIN SODIUM 40 MG/0.4ML IJ SOSY
40.0000 mg | PREFILLED_SYRINGE | INTRAMUSCULAR | Status: DC
  Administered 2023-12-13 – 2023-12-17 (×5): 40 mg via SUBCUTANEOUS
  Filled 2023-12-12 (×5): qty 0.4

## 2023-12-12 MED ORDER — EPHEDRINE SULFATE-NACL 50-0.9 MG/10ML-% IV SOSY
PREFILLED_SYRINGE | INTRAVENOUS | Status: DC | PRN
  Administered 2023-12-12 (×5): 5 mg via INTRAVENOUS

## 2023-12-12 MED ORDER — OXYCODONE HCL 5 MG PO TABS
2.5000 mg | ORAL_TABLET | ORAL | Status: DC | PRN
  Administered 2023-12-12 – 2023-12-17 (×12): 5 mg via ORAL
  Filled 2023-12-12 (×12): qty 1

## 2023-12-12 MED ORDER — ROCURONIUM BROMIDE 10 MG/ML (PF) SYRINGE
PREFILLED_SYRINGE | INTRAVENOUS | Status: AC
  Filled 2023-12-12: qty 20

## 2023-12-12 MED ORDER — METOCLOPRAMIDE HCL 5 MG PO TABS: 5.0000 mg | ORAL_TABLET | Freq: Three times a day (TID) | ORAL | Status: DC | PRN

## 2023-12-12 MED ORDER — DIPHENHYDRAMINE HCL 12.5 MG/5ML PO ELIX
12.5000 mg | ORAL_SOLUTION | ORAL | Status: DC | PRN
  Administered 2023-12-15: 25 mg via ORAL
  Filled 2023-12-12: qty 10

## 2023-12-12 MED ORDER — POVIDONE-IODINE 10 % EX SWAB
2.0000 | Freq: Once | CUTANEOUS | Status: AC
  Administered 2023-12-12: 2 via TOPICAL

## 2023-12-12 MED ORDER — METHOCARBAMOL 1000 MG/10ML IJ SOLN: 500.0000 mg | Freq: Four times a day (QID) | INTRAMUSCULAR | Status: DC | PRN

## 2023-12-12 MED ORDER — LACTATED RINGERS IV SOLN: INTRAVENOUS | Status: DC

## 2023-12-12 MED ORDER — ORAL CARE MOUTH RINSE: 15.0000 mL | Freq: Once | OROMUCOSAL | Status: AC

## 2023-12-12 MED ORDER — TRANEXAMIC ACID-NACL 1000-0.7 MG/100ML-% IV SOLN
1000.0000 mg | INTRAVENOUS | Status: AC
  Administered 2023-12-12: 1000 mg via INTRAVENOUS

## 2023-12-12 MED ORDER — LACTATED RINGERS IV SOLN: INTRAVENOUS | Status: DC | PRN

## 2023-12-12 SURGICAL SUPPLY — 73 items
11.5mm x 34cm 130 right trigen intertan ×1 IMPLANT
BAG COUNTER SPONGE SURGICOUNT (BAG) ×2 IMPLANT
BANDAGE ESMARK 6X9 LF (GAUZE/BANDAGES/DRESSINGS) ×2 IMPLANT
BIT DRILL INTERTAN LAG SCREW (BIT) IMPLANT
BIT DRILL SHORT 4.0 (BIT) IMPLANT
BLADE SURG 10 STRL SS (BLADE) ×4 IMPLANT
BNDG COHESIVE 4X5 TAN STRL LF (GAUZE/BANDAGES/DRESSINGS) ×2 IMPLANT
BNDG COHESIVE 6X5 TAN ST LF (GAUZE/BANDAGES/DRESSINGS) ×2 IMPLANT
BNDG ELASTIC 4X5.8 VLCR STR LF (GAUZE/BANDAGES/DRESSINGS) ×2 IMPLANT
BNDG ELASTIC 6INX 5YD STR LF (GAUZE/BANDAGES/DRESSINGS) ×2 IMPLANT
BNDG GAUZE DERMACEA FLUFF 4 (GAUZE/BANDAGES/DRESSINGS) ×4 IMPLANT
BRUSH SCRUB EZ PLAIN DRY (MISCELLANEOUS) ×4 IMPLANT
CHLORAPREP W/TINT 26 (MISCELLANEOUS) ×2 IMPLANT
COVER SURGICAL LIGHT HANDLE (MISCELLANEOUS) ×4 IMPLANT
CUFF TOURN SGL QUICK 18X4 (TOURNIQUET CUFF) IMPLANT
CUFF TRNQT CYL 24X4X16.5-23 (TOURNIQUET CUFF) IMPLANT
CUFF TRNQT CYL 34X4.125X (TOURNIQUET CUFF) IMPLANT
DERMABOND ADVANCED .7 DNX12 (GAUZE/BANDAGES/DRESSINGS) IMPLANT
DRAPE C-ARM 35X43 STRL (DRAPES) ×2 IMPLANT
DRAPE C-ARM 42X72 X-RAY (DRAPES) IMPLANT
DRAPE C-ARMOR (DRAPES) ×2 IMPLANT
DRAPE HALF SHEET 40X57 (DRAPES) ×4 IMPLANT
DRAPE IMP U-DRAPE 54X76 (DRAPES) ×4 IMPLANT
DRAPE INCISE IOBAN 66X45 STRL (DRAPES) ×2 IMPLANT
DRAPE SURG 17X23 STRL (DRAPES) ×2 IMPLANT
DRAPE SURG ORHT 6 SPLT 77X108 (DRAPES) ×4 IMPLANT
DRAPE U-SHAPE 47X51 STRL (DRAPES) ×2 IMPLANT
DRESSING MEPILEX FLEX 4X4 (GAUZE/BANDAGES/DRESSINGS) ×6 IMPLANT
DRSG ADAPTIC 3X8 NADH LF (GAUZE/BANDAGES/DRESSINGS) ×2 IMPLANT
DRSG MEPILEX POST OP 4X8 (GAUZE/BANDAGES/DRESSINGS) ×2 IMPLANT
ELECTRODE REM PT RTRN 9FT ADLT (ELECTROSURGICAL) ×2 IMPLANT
EXTRACTOR CONICAL BOLT FOR TFN (ORTHOPEDIC DISPOSABLE SUPPLIES) IMPLANT
GAUZE SPONGE 4X4 12PLY STRL (GAUZE/BANDAGES/DRESSINGS) ×2 IMPLANT
GLOVE BIO SURGEON STRL SZ 6.5 (GLOVE) ×6 IMPLANT
GLOVE BIO SURGEON STRL SZ7.5 (GLOVE) ×8 IMPLANT
GLOVE BIOGEL PI IND STRL 6.5 (GLOVE) ×2 IMPLANT
GLOVE BIOGEL PI IND STRL 7.5 (GLOVE) ×2 IMPLANT
GOWN STRL REUS W/ TWL LRG LVL3 (GOWN DISPOSABLE) ×6 IMPLANT
GOWN STRL REUS W/ TWL XL LVL3 (GOWN DISPOSABLE) ×2 IMPLANT
KIT BASIN OR (CUSTOM PROCEDURE TRAY) ×2 IMPLANT
KIT TURNOVER KIT B (KITS) ×2 IMPLANT
MANIFOLD NEPTUNE II (INSTRUMENTS) ×2 IMPLANT
NAIL IM ANGLED 11.5X340 130D (Nail) IMPLANT
NDL 22X1.5 STRL (OR ONLY) (MISCELLANEOUS) IMPLANT
NEEDLE 22X1.5 STRL (OR ONLY) (MISCELLANEOUS) IMPLANT
NS IRRIG 1000ML POUR BTL (IV SOLUTION) ×2 IMPLANT
PACK GENERAL/GYN (CUSTOM PROCEDURE TRAY) ×2 IMPLANT
PACK ORTHO EXTREMITY (CUSTOM PROCEDURE TRAY) ×2 IMPLANT
PAD ARMBOARD POSITIONER FOAM (MISCELLANEOUS) ×4 IMPLANT
PADDING CAST COTTON 6X4 STRL (CAST SUPPLIES) ×6 IMPLANT
PIN GUIDE 3.2X343MM (PIN) IMPLANT
ROD GUIDE 3.0 (MISCELLANEOUS) IMPLANT
SCREW EXTRACT NAIL (MISCELLANEOUS) IMPLANT
SCREW LAG COMPR KIT 85/80 (Screw) IMPLANT
SCREW TRIGEN LOW PROF 5.0X37.5 (Screw) IMPLANT
SPONGE T-LAP 18X18 ~~LOC~~+RFID (SPONGE) ×2 IMPLANT
STAPLER SKIN PROX 35W (STAPLE) ×2 IMPLANT
STOCKINETTE IMPERVIOUS LG (DRAPES) ×2 IMPLANT
STRIP CLOSURE SKIN 1/2X4 (GAUZE/BANDAGES/DRESSINGS) IMPLANT
SUCTION TUBE FRAZIER 10FR DISP (SUCTIONS) IMPLANT
SUT ETHILON 3 0 PS 1 (SUTURE) ×2 IMPLANT
SUT MNCRL AB 3-0 PS2 18 (SUTURE) ×2 IMPLANT
SUT MON AB 2-0 CT1 36 (SUTURE) ×2 IMPLANT
SUT PDS AB 2-0 CT1 27 (SUTURE) IMPLANT
SUT VIC AB 0 CT1 27XBRD ANBCTR (SUTURE) IMPLANT
SUT VIC AB 2-0 CT1 TAPERPNT 27 (SUTURE) ×4 IMPLANT
SYR CONTROL 10ML LL (SYRINGE) IMPLANT
TOWEL GREEN STERILE (TOWEL DISPOSABLE) ×4 IMPLANT
TOWEL GREEN STERILE FF (TOWEL DISPOSABLE) ×4 IMPLANT
TUBE CONNECTING 12X1/4 (SUCTIONS) ×2 IMPLANT
UNDERPAD 30X36 HEAVY ABSORB (UNDERPADS AND DIAPERS) ×2 IMPLANT
WATER STERILE IRR 1000ML POUR (IV SOLUTION) ×4 IMPLANT
YANKAUER SUCT BULB TIP NO VENT (SUCTIONS) ×2 IMPLANT

## 2023-12-12 NOTE — Interval H&P Note (Signed)
 History and Physical Interval Note:  12/12/2023 9:33 AM  Winton GORMAN Ramp  has presented today for surgery, with the diagnosis of FALL.  The various methods of treatment have been discussed with the patient and family. After consideration of risks, benefits and other options for treatment, the patient has consented to  Procedure(s) with comments: INSERTION, INTRAMEDULLARY ROD, FEMUR (Right) - REVISION IM NAIL RIGHT FEMUR REMOVAL, HARDWARE (Right) as a surgical intervention.  The patient's history has been reviewed, patient examined, no change in status, stable for surgery.  I have reviewed the patient's chart and labs.  Questions were answered to the patient's satisfaction.     Shye Doty P Kamron Portee

## 2023-12-12 NOTE — Anesthesia Preprocedure Evaluation (Addendum)
 Anesthesia Evaluation  Patient identified by MRN, date of birth, ID band Patient awake    Reviewed: Allergy & Precautions, NPO status , Patient's Chart, lab work & pertinent test results  History of Anesthesia Complications Negative for: history of anesthetic complications  Airway Mallampati: II  TM Distance: >3 FB Neck ROM: Full    Dental  (+) Dental Advisory Given, Partial Lower, Edentulous Upper   Pulmonary former smoker   Pulmonary exam normal        Cardiovascular hypertension, Normal cardiovascular exam     Neuro/Psych  Headaches PSYCHIATRIC DISORDERS Anxiety Depression    TIA Neuromuscular disease    GI/Hepatic Neg liver ROS,GERD  Controlled,,  Endo/Other   Obesity Pre-DM   Renal/GU CRFRenal disease     Musculoskeletal  (+)  Fibromyalgia -  Abdominal   Peds  Hematology  (+) Blood dyscrasia, anemia   Anesthesia Other Findings   Reproductive/Obstetrics                              Anesthesia Physical Anesthesia Plan  ASA: 3  Anesthesia Plan: General   Post-op Pain Management: Tylenol  PO (pre-op)*   Induction: Intravenous  PONV Risk Score and Plan: 3 and Treatment may vary due to age or medical condition, Ondansetron  and Propofol  infusion  Airway Management Planned: Oral ETT  Additional Equipment: None  Intra-op Plan:   Post-operative Plan: Extubation in OR  Informed Consent: I have reviewed the patients History and Physical, chart, labs and discussed the procedure including the risks, benefits and alternatives for the proposed anesthesia with the patient or authorized representative who has indicated his/her understanding and acceptance.     Dental advisory given  Plan Discussed with: CRNA and Anesthesiologist  Anesthesia Plan Comments:          Anesthesia Quick Evaluation

## 2023-12-12 NOTE — Op Note (Signed)
 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 Item Serial No. Manufacturer Lot No. LRB No. Used Action  11.54mm x 34cm 130 right trigen [employer] [id number] Right 1 Implanted  SCREW LAG COMPR KIT 85/80 - [id number] Screw SCREW LAG COMPR KIT 85/80  [employer] [id number] Right 1 Implanted  SCREW TRIGEN LOW PROF 5.0X37.5 - [id number] Screw SCREW TRIGEN LOW PROF 5.0X37.5  [employer] [id number] Right 1 Implanted     Indications for Surgery: 78 year old female who underwent cephalomedullary nailing of her right proximal femur fracture in March 2025 at [facility].  Unfortunately she continued to have pain and subsequent failed her hardware.  She had presented to the emergency room with failed fixation with questionable new fracture.  I felt that this was a persistent nonunion with broken rod.  I recommend proceeding with hardware removal and repair of her nonunion.  Risks and benefits were discussed with the patient and her daughter.  Risks include but not limited to bleeding, infection, malunion, nonunion, hardware failure, hardware irritation, nerve blood vessel injury, persistent pain, DVT, even a possible anesthetic complications.  They agreed to proceed with surgery consent was obtained.  Operative Findings: 1.  Removal of  broken cephalomedullary nail that was in place. 2.  Revision fixation of right proximal femur nonunion using [employer] Inter[employer]AN 11.5 x 340 mm nail with a 85 mm lag screw and 80 mm compression screw.  Procedure: The patient was identified in the preoperative holding area. Consent was confirmed with the patient and their family and all questions were answered. The operative extremity was marked after confirmation with the patient. she was then brought back to the operating room by our anesthesia colleagues.  She was placed under general anesthetic and carefully transferred over to the radiolucent flattop table.  A bump was placed in her operative hip.  The right lower extremity was then prepped and draped in usual sterile fashion.  A timeout was performed to verify the patient, the procedure, and the extremity.  Preoperative antibiotics were dosed.  The hip and knee were flexed over a triangle fluoroscopic imaging showed the unstable nature of her injury.  I reopened the proximal incision and proceeded to thread a conical extraction device in the proximal portion of the broken nail.  I was able to do this successfully and remove the proximal portion of the nail.  I then made a small incision over the lateral aspect just posterior to his previous incision to remove the lag screw from the femoral head.  I then was able to thread into the distal portion and the extractor bolt and I was able to remove the distal portion after removing the distal interlocking screws using fluoroscopic guidance.  After I had the nail removed I then placed a ball-tipped guidewire down the center canal.  The length of the  previous nail was 360 mm nail.  I decided to use a 340 mm nail.  I reamed up to 13 mm and then placed a 11.5 x 340 mm nail down the center of the canal attached to the targeting arm.  The nonunion aligned appropriately.  I then used the targeting arm to direct a threaded guidewire into the head/neck segment.  I  measured the length of interest using a 5 mm lag screw.  I then drilled the path for the compression screw placed antirotation bar I then drilled the path for the lag screw and placed the lag screw.  I then compressed with a compression screw and was able to compress approximately 3 mm.  Statically locked the proximal portion of the nail.  Using perfect circle technique I placed a lateral to medial distal locking screw.  It is lined up perfectly through the previous interlocking screw hole.  Final fluoroscopic imaging was obtained.  The incision was copiously irrigated.  Layered closure was made with 2-0 Monocryl and Dermabond.  Sterile dressings were applied.  The patient was then awoke from anesthesia and taken to the PACU in stable condition.  Post Op Plan/Instructions: Patient be weightbearing as tolerated to the right lower extremity.  She will receive postoperative Ancef .  She will receive Lovenox  for DVT prophylaxis and discharge on oral DOAC.  Will have her mobilize with physical and Occupational Therapy.  I was present and performed the entire surgery.  Lauraine Moores, PA-C did assist me throughout the case. An assistant was necessary given the difficulty in approach, maintenance of reduction and ability to instrument the fracture.   Franky Light, MD [facility]

## 2023-12-12 NOTE — Hospital Course (Addendum)
 [name] is a 78 y.o. year old female with PMH of  of right-sided hip fracture in March 2025 fixation, essential hypertension, mitral valve regurgitation, chronic pain syndrome, generalized anxiety disorder and depression presented ED after a fall from from the rollator. Seen in the ED with previous history of right hip fracture with fixation in March workup revealed periprosthetic fracture orthopedic consulted and transferred to [facility] for operative intervention. S/p S/p IM rod femur right ORIF 9/4 PT OT following recommending skilled nursing facility. SNF available on 9/9 and stable for discharge  Subjective: Seen and examined On bedside chair Eager to go to rehab today No new complaints, waiting on placement  Discharge diagnosis:   Close right-sided hip fracture Periprosthetic hip fracture status post fall: S/p IM rod femur right ORIF 9/4. Continue multimodal pain management, DVT prophylaxis and PT OT as per orthopedics service On Lovenox  from 9/5> on discharge ortho plannig for eliquis  for vte prophylaxis At this time she is is stable for discharge    Normocytic anemia:  Mild ABLA hemoglobin stable Recent Labs  Lab 12/10/23 2219 12/11/23 0326 12/12/23 0442 12/13/23 0509 12/14/23 0548  HGB 12.3 10.7* 10.9* 9.7* 10.4*  HCT 38.7 34.7* 34.5* 29.9* 32.2*     Essential hypertension Hyperlipidemia GERD: Not on meds.BP soft. Cont supportive care.   Anxiety and and depression Chronic pain syndrome Fibromyalgia: Mood stable, continue Lyrica  Cymbalta . She is forgetful about the events of fall post op in PACU but now able to recollect, daughter indicates she possibly has early dementia but she is mostly alert awake oriented interactive communicative and appropriate.   Class I Obesity w/ Body mass index is 30.82 kg/m.: Will benefit with PCP follow-up, weight loss,healthy lifestyle and outpatient sleep eval if not done.  Mobility: PT Orders: Active PT Follow up Rec:  Skilled Nursing-Short Term Rehab (<3 Hours/Day)12/16/2023 1513    DVT prophylaxis: enoxaparin  (LOVENOX ) injection 40 mg Start: 12/13/23 0800 SCDs Start: 12/12/23 1353 SCDs Start: 12/11/23 0253 Place TED hose Start: 12/11/23 0253 Code Status:   Code Status: Full Code Family Communication: plan of care discussed with patient  Patient status is: Remains hospitalized because of severity of illness Level of care: Med-Surg   Dispo: The patient is from: home w/ family            Anticipated disposition: SNF   Objective: Vitals last 24 hrs: Vitals:   12/16/23 1559 12/16/23 1941 12/17/23 0405 12/17/23 0712  BP: 126/86 136/63 (!) 132/51 (!) 151/56  Pulse: 90 82 84 72  Resp: 18 18 18    Temp: 97.7 F (36.5 C) 98.2 F (36.8 C) 98 F (36.7 C) 98.2 F (36.8 C)  TempSrc: Oral Oral Oral Oral  SpO2: 98% 96% 94% 98%  Weight:      Height:        Physical Examination: General exam: Alert awake pleasant conversant  HEENT:Oral mucosa moist, Ear/Nose WNL grossly Respiratory system: Bilaterally clear BS,no use of accessory muscle Cardiovascular system: S1 & S2 +, No JVD. Gastrointestinal system: Abdomen soft,NT,ND, BS+ Nervous System: Alert, awake, moving all extremities,and following commands. Extremities:distal extremities warm.  Dressing clean dry intact on right hip Skin: No rashes,no icterus. MSK: Normal muscle bulk,tone, power

## 2023-12-12 NOTE — Anesthesia Postprocedure Evaluation (Signed)
 Anesthesia Post Note  Patient: [name]  Procedure(s) Performed: INSERTION, INTRAMEDULLARY ROD, FEMUR (Right) REMOVAL, HARDWARE (Right)     Patient location during evaluation: PACU Anesthesia Type: General Level of consciousness: awake and alert Pain management: pain level controlled Vital Signs Assessment: post-procedure vital signs reviewed and stable Respiratory status: spontaneous breathing, nonlabored ventilation, respiratory function stable and patient connected to nasal cannula oxygen Cardiovascular status: blood pressure returned to baseline and stable Postop Assessment: no apparent nausea or vomiting Anesthetic complications: no   No notable events documented.  Last Vitals:  Vitals:   12/12/23 1300 12/12/23 1315  BP: (!) 148/77 (!) 109/49  Pulse: 80 76  Resp: 16 16  Temp:  36.4 C  SpO2: 95% 94%    Last Pain:  Vitals:   12/12/23 1230  TempSrc:   PainSc: Asleep                 Debby FORBES Like

## 2023-12-12 NOTE — Progress Notes (Signed)
 PROGRESS NOTE [name]  FMW:[id number] DOB: 16-Aug-1945 DOA: 12/10/2023 PCP: Pcp, No  Brief Narrative/Hospital Course: [name] is a 78 y.o. year old female with PMH of  of right-sided hip fracture in March 2025 fixation, essential hypertension, mitral valve regurgitation, chronic pain syndrome, generalized anxiety disorder and depression presented ED after a fall from from the rollator. Seen in the ED with previous history of right hip fracture with fixation in March workup revealed periprosthetic fracture orthopedic consulted and transferred to [facility] for operative intervention.  Subjective: Seen and examined today Underwent operative intervention this morning Seen in the PACU she is alert awake oriented to self place people Does not remember how she fell or what happened Moving all 4 extremities well no focal weakness, appears pleasant  Assessment and plan:   Close right-sided hip fracture Periprosthetic hip fracture status post fall: history of left-sided hip fracture status postrepair in March 2025. Now with fall and CT shows oblique fracture through the proximal diaphysis of the right femur surrounding the intramedullary nail and chronic and healing right intertrochanteric fracture. S/p IM rod femur right Continue multimodal pain management, DVT prophylaxis and PT OT as per orthopedics   Normocytic anemia:  Patient had drop since admission 19 g range, stable watch for ABLA and transfuse less than 7 g   Essential hypertension Hyperlipidemia GERD: Not on meds.  BP stable   Anxiety and and depression Chronic pain syndrome Fibromyalgia Continue Lyrica  Cymbalta .    Class I Obesity w/ Body mass index is 30.82 kg/m.: Will benefit with PCP follow-up, weight loss,healthy lifestyle and outpatient sleep eval if not done.  Mobility: PT Orders:  PT Follow up Rec:    DVT prophylaxis: SCDs Start: 12/11/23 0253 Place TED hose Start: 12/11/23 0253 Code Status:   Code  Status: Full Code Family Communication: plan of care discussed with patien  at bedside. Patient status is: Remains hospitalized because of severity of illness Level of care: Med-Surg   Dispo: The patient is from: home            Anticipated disposition: TBD Objective: Vitals last 24 hrs: Vitals:   12/12/23 1230 12/12/23 1245 12/12/23 1300 12/12/23 1315  BP: 126/76 120/83 (!) 148/77 (!) 109/49  Pulse: 81 81 80 76  Resp: 14 19 16 16   Temp:    97.6 F (36.4 C)  TempSrc:      SpO2: 94% 96% 95% 94%  Weight:      Height:        Physical Examination: General exam: alert awake, oriented to self place people HEENT:Oral mucosa moist, Ear/Nose WNL grossly Respiratory system: Bilaterally clear BS,no use of accessory muscle Cardiovascular system: S1 & S2 +, No JVD. Gastrointestinal system: Abdomen soft,NT,ND, BS+ Nervous System: Alert, awake, moving all extremities,and following commands. Extremities: LE edema neg, distal extremities warm.  Skin: No rashes,no icterus. MSK: Normal muscle bulk,tone, power   Medications reviewed:  Scheduled Meds:  chlorhexidine   60 mL Topical Once   [MAR Hold] DULoxetine   60 mg Oral QPM   [MAR Hold] mupirocin  ointment  1 Application Nasal BID   [MAR Hold] pregabalin   50 mg Oral QPM   [MAR Hold] sodium chloride  flush  3 mL Intravenous Q12H   [MAR Hold] sodium chloride  flush  3 mL Intravenous Q12H   Continuous Infusions:  lactated ringers      Diet: Diet Order             Diet NPO time specified  Diet effective midnight  Data Reviewed: I have personally reviewed following labs and imaging studies ( see epic result tab) CBC: Recent Labs  Lab 12/10/23 2219 12/11/23 0326 12/12/23 0442  WBC 7.6 6.2 6.7  HGB 12.3 10.7* 10.9*  HCT 38.7 34.7* 34.5*  MCV 94.9 96.4 97.2  PLT 264 223 220   CMP: Recent Labs  Lab 12/10/23 2320 12/11/23 0326 12/12/23 0442  NA 140 139 139  K 3.8 3.9 4.6  CL 106 108 106  CO2 22 22 24    GLUCOSE 95 118* 113*  BUN 15 14 16   CREATININE 0.66 0.76 1.13*  CALCIUM  9.4 8.7* 9.3   GFR: Estimated Creatinine Clearance: 42.5 mL/min (A) (by C-G formula based on SCr of 1.13 mg/dL (H)). No results for input(s): AST, ALT, ALKPHOS, BILITOT, PROT, ALBUMIN in the last 168 hours. No results for input(s): LIPASE, AMYLASE in the last 168 hours. No results for input(s): AMMONIA in the last 168 hours. Coagulation Profile:  Recent Labs  Lab 12/11/23 0326  INR 1.1   Unresulted Labs (From admission, onward)     Start     Ordered   Signed and Held  VITAMIN D  25 Hydroxy (Vit-D Deficiency, Fractures)  Once,   R       Question:  Specimen collection method  Answer:  Lab=Lab collect   Signed and Held   Signed and Held  CBC  (enoxaparin  (LOVENOX )    CrCl >/= 30 ml/min)  Once,   R       Comments: Baseline for enoxaparin  therapy IF NOT ALREADY DRAWN.  Notify MD if PLT < 100 K.   Question:  Specimen collection method  Answer:  Lab=Lab collect   Signed and Held   Signed and Held  Creatinine, serum  (enoxaparin  (LOVENOX )    CrCl >/= 30 ml/min)  Once,   R       Comments: Baseline for enoxaparin  therapy IF NOT ALREADY DRAWN.   Question:  Specimen collection method  Answer:  Lab=Lab collect   Signed and Held   Signed and Held  Creatinine, serum  (enoxaparin  (LOVENOX )    CrCl >/= 30 ml/min)  Weekly,   R     Comments: while on enoxaparin  therapy   Question:  Specimen collection method  Answer:  Lab=Lab collect   Signed and Held   Signed and Held  Basic metabolic panel  Tomorrow morning,   R       Question:  Specimen collection method  Answer:  Lab=Lab collect   Signed and Held   Signed and Held  CBC  Daily,   R     Question:  Specimen collection method  Answer:  Lab=Lab collect   Signed and Held           Antimicrobials/Microbiology: Anti-infectives (From admission, onward)    Start     Dose/Rate Route Frequency Ordered Stop   12/12/23 0900  ceFAZolin  (ANCEF ) IVPB 2g/100 mL  premix        2 g 200 mL/hr over 30 Minutes Intravenous On call to O.R. 12/12/23 0808 12/12/23 1117   12/12/23 0812  ceFAZolin  (ANCEF ) 2-4 GM/100ML-% IVPB       Note to Pharmacy: Coni Sensor M: cabinet override      12/12/23 0812 12/12/23 1053      No results found for: SDES, SPECREQUEST, CULT, REPTSTATUS  Procedures: Procedure(s) (LRB): INSERTION, INTRAMEDULLARY ROD, FEMUR (Right) REMOVAL, HARDWARE (Right)   Courtney LAMY, MD [facility] 12/12/2023, 1:24 PM

## 2023-12-12 NOTE — Progress Notes (Signed)
Pt has been NPO since midnight.  ? ?Palmina Clodfelter M Narcissa Melder ? ?

## 2023-12-12 NOTE — Transfer of Care (Signed)
 Immediate Anesthesia Transfer of Care Note  Patient: [name]  Procedure(s) Performed: INSERTION, INTRAMEDULLARY ROD, FEMUR (Right) REMOVAL, HARDWARE (Right)  Patient Location: PACU  Anesthesia Type:General  Level of Consciousness: awake, alert , and oriented  Airway & Oxygen Therapy: Patient Spontanous Breathing and Patient connected to face mask oxygen  Post-op Assessment: Report given to RN and Post -op Vital signs reviewed and stable  Post vital signs: Reviewed and stable  Last Vitals:  Vitals Value Taken Time  BP 154/81 12/12/23 12:10  Temp 36.4 C 12/12/23 12:10  Pulse 90 12/12/23 12:14  Resp 13 12/12/23 12:14  SpO2 87 % 12/12/23 12:14  Vitals shown include unfiled device data.  Last Pain:  Vitals:   12/12/23 0830  TempSrc:   PainSc: 7          Complications: No notable events documented.

## 2023-12-12 NOTE — Anesthesia Procedure Notes (Signed)
 Procedure Name: Intubation Date/Time: 12/12/2023 10:37 AM  Performed by: Vertie Arthea RAMAN, CRNAPre-anesthesia Checklist: Patient identified, Emergency Drugs available, Suction available and Patient being monitored Patient Re-evaluated:Patient Re-evaluated prior to induction Oxygen Delivery Method: Circle System Utilized Preoxygenation: Pre-oxygenation with 100% oxygen Induction Type: IV induction Ventilation: Mask ventilation without difficulty and Oral airway inserted - appropriate to patient size Laryngoscope Size: Mac and 3 Grade View: Grade I Tube type: Oral Tube size: 7.0 mm Number of attempts: 1 Airway Equipment and Method: Stylet and Oral airway Placement Confirmation: ETT inserted through vocal cords under direct vision, positive ETCO2 and breath sounds checked- equal and bilateral Tube secured with: Tape Dental Injury: Teeth and Oropharynx as per pre-operative assessment

## 2023-12-13 ENCOUNTER — Encounter (HOSPITAL_COMMUNITY): Payer: Self-pay | Admitting: Student

## 2023-12-13 DIAGNOSIS — S72001A Fracture of unspecified part of neck of right femur, initial encounter for closed fracture: Secondary | ICD-10-CM | POA: Diagnosis not present

## 2023-12-13 LAB — BASIC METABOLIC PANEL WITH GFR
Anion gap: 7 (ref 5–15)
BUN: 16 mg/dL (ref 8–23)
CO2: 22 mmol/L (ref 22–32)
Calcium: 9.2 mg/dL (ref 8.9–10.3)
Chloride: 106 mmol/L (ref 98–111)
Creatinine, Ser: 0.99 mg/dL (ref 0.44–1.00)
GFR, Estimated: 58 mL/min — ABNORMAL LOW (ref >60)
Glucose, Bld: 156 mg/dL — ABNORMAL HIGH (ref 70–99)
Potassium: 4.4 mmol/L (ref 3.5–5.1)
Sodium: 135 mmol/L (ref 135–145)

## 2023-12-13 LAB — CBC
HCT: 29.9 % — ABNORMAL LOW (ref 36.0–46.0)
Hemoglobin: 9.7 g/dL — ABNORMAL LOW (ref 12.0–15.0)
MCH: 31 pg (ref 26.0–34.0)
MCHC: 32.4 g/dL (ref 30.0–36.0)
MCV: 95.5 fL (ref 80.0–100.0)
Platelets: 192 K/uL (ref 150–400)
RBC: 3.13 MIL/uL — ABNORMAL LOW (ref 3.87–5.11)
RDW: 13.5 % (ref 11.5–15.5)
WBC: 10.5 K/uL (ref 4.0–10.5)
nRBC: 0 % (ref 0.0–0.2)

## 2023-12-13 MED ORDER — VITAMIN D 25 MCG (1000 UNIT) PO TABS
1000.0000 [IU] | ORAL_TABLET | Freq: Every day | ORAL | Status: DC
  Administered 2023-12-13 – 2023-12-17 (×5): 1000 [IU] via ORAL
  Filled 2023-12-13 (×5): qty 1

## 2023-12-13 NOTE — Evaluation (Addendum)
 Physical Therapy Evaluation  Patient Details Name: [name] MRN: [id number] DOB: February 28, 1946 Today's Date: 12/13/2023  History of Present Illness  Pt is an 78 y/o female who presents 12/10/2023 with failed fixation with questionable new fracture of her right proximal femur fracture (originally sustained and fixed in March 2025 at [facility]). 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 Kendal on 12/12/2023. PMH otherwise insignificant to this case.   Clinical Impression  Pt admitted with above diagnosis. Pt currently with functional limitations due to the deficits listed below (see PT Problem List). At the time of PT eval pt was able to perform transfers and ambulation with gross CGA to min assist and RW for support. Pt with decreased cognition, however visitors report pt is confused at baseline. Pt will have 24 hour support from 2 daughters and friends at d/c. Pt will benefit from acute skilled PT to increase their independence and safety with mobility to allow discharge.       Addendum: Family now requesting SNF level therapies at d/c, stating pt will not have 24 hour support as originally reported to PT. Feel this is reasonable given pt's cognitive deficits and need for minimal assist. Recommendations updated.     If plan is discharge home, recommend the following: A little help with walking and/or transfers;A little help with bathing/dressing/bathroom;Assistance with cooking/housework;Assist for transportation;Help with stairs or ramp for entrance;Supervision due to cognitive status   Can travel by private vehicle        Equipment Recommendations BSC/3in1  Recommendations for Other Services       Functional Status Assessment Patient has had a recent decline in their functional status and demonstrates the ability to make significant improvements in function in a reasonable and predictable amount of time.     Precautions / Restrictions  Precautions Precautions: Fall Recall of Precautions/Restrictions: Impaired Precaution/Restrictions Comments: Requires frequent cues and repetition of cues at times. Restrictions Weight Bearing Restrictions Per Provider Order: Yes RLE Weight Bearing Per Provider Order: Weight bearing as tolerated      Mobility  Bed Mobility Overal bed mobility: Needs Assistance Bed Mobility: Supine to Sit     Supine to sit: Min assist     General bed mobility comments: Assist for RLE movement and . Pt was able to transition fully to EOB and assisting to manage RLE as well. Increased time to get feet on the floor.    Transfers Overall transfer level: Needs assistance Equipment used: Rolling walker (2 wheels) Transfers: Sit to/from Stand Sit to Stand: Contact guard assist           General transfer comment: VC's for hand placement on seated surface for safety. Hands on guarding for safety as pt powered up to full stand.    Ambulation/Gait Ambulation/Gait assistance: Contact guard assist Gait Distance (Feet): 65 Feet Assistive device: Rolling walker (2 wheels) Gait Pattern/deviations: Step-through pattern, Decreased stride length, Trunk flexed Gait velocity: Decreased Gait velocity interpretation: <1.31 ft/sec, indicative of household ambulator   General Gait Details: VC's for improved posture, closer walker proximity and forward gaze. Pt holding R heel up and weight bearing through ball of foot on the R instead. Able to improve some with cues but unable to heel strike.  Stairs            Wheelchair Mobility     Tilt Bed    Modified Rankin (Stroke Patients Only)       Balance Overall balance assessment: Needs  assistance Sitting-balance support: Feet supported, No upper extremity supported Sitting balance-Leahy Scale: Fair     Standing balance support: During functional activity, Bilateral upper extremity supported, Reliant on assistive device for balance Standing  balance-Leahy Scale: Poor                               Pertinent Vitals/Pain Pain Assessment Pain Assessment: Faces Faces Pain Scale: Hurts little more Pain Location: R hip Pain Descriptors / Indicators: Operative site guarding, Sore Pain Intervention(s): Limited activity within patient's tolerance, Monitored during session, Repositioned    Home Living Family/patient expects to be discharged to:: Private residence Living Arrangements: Alone Available Help at Discharge: Family;Available 24 hours/day Type of Home: House Home Access: Stairs to enter Entrance Stairs-Rails: Left Entrance Stairs-Number of Steps: 4   Home Layout: One level Home Equipment: Rollator (4 wheels);Rolling Walker (2 wheels);Shower seat;Grab bars - tub/shower;Grab bars - toilet;Hand held shower head      Prior Function Prior Level of Function : Independent/Modified Independent;History of Falls (last six months)             Mobility Comments: Uses the rollator ADLs Comments: Indepedent     Extremity/Trunk Assessment   Upper Extremity Assessment Upper Extremity Assessment: Overall WFL for tasks assessed    Lower Extremity Assessment Lower Extremity Assessment: RLE deficits/detail RLE Deficits / Details: Acute pain, dereased strength and AROM consistent with above mentioned surgery. RLE: Unable to fully assess due to pain    Cervical / Trunk Assessment Cervical / Trunk Assessment: Kyphotic (mild)  Communication   Communication Communication: No apparent difficulties    Cognition Arousal: Alert Behavior During Therapy: WFL for tasks assessed/performed   PT - Cognitive impairments: History of cognitive impairments, Safety/Judgement                       PT - Cognition Comments: Not listed in history, but per visitors present in room pt confused sometimes at baseline. Visitors recalling home set up of daughter, pt requires increased time to think of answers to  questions. Frequently forgetting about walker when initiating movement. Following commands: Intact       Cueing Cueing Techniques: Verbal cues, Gestural cues     General Comments      Exercises General Exercises - Lower Extremity Long Arc Quad: 5 reps   Assessment/Plan    PT Assessment Patient needs continued PT services  PT Problem List Decreased strength;Decreased range of motion;Decreased activity tolerance;Decreased mobility;Decreased balance;Decreased knowledge of use of DME;Decreased safety awareness;Decreased knowledge of precautions;Pain       PT Treatment Interventions DME instruction;Gait training;Stair training;Functional mobility training;Therapeutic exercise;Therapeutic activities;Balance training;Patient/family education;Cognitive remediation    PT Goals (Current goals can be found in the Care Plan section)  Acute Rehab PT Goals Patient Stated Goal[place] Goal Formulation: With patient/family Time For Goal Achievement: 12/20/23 Potential to Achieve Goals: Good    Frequency Min 2X/week     Co-evaluation               AM-PAC PT 6 Clicks Mobility  Outcome Measure Help needed turning from your back to your side while in a flat bed without using bedrails?: A Little Help needed moving from lying on your back to sitting on the side of a flat bed without using bedrails?: A Little Help needed moving to and from a bed to a chair (including  a wheelchair)?: A Little Help needed standing up from a chair using your arms (e.g., wheelchair or bedside chair)?: A Little Help needed to walk in hospital room?: A Little Help needed climbing 3-5 steps with a railing? : A Little 6 Click Score: 18    End of Session Equipment Utilized During Treatment: Gait belt Activity Tolerance: Patient tolerated treatment well Patient left: in chair;with call bell/phone within reach;with chair alarm set;with family/visitor present Nurse  Communication: Mobility status PT Visit Diagnosis: Unsteadiness on feet (R26.81);Pain Pain - Right/Left: Right Pain - part of body: Hip    Time: 1037-1101 PT Time Calculation (min) (ACUTE ONLY): 24 min   Charges:   PT Evaluation $PT Eval Moderate Complexity: 1 Mod PT Treatments $Gait Training: 8-22 mins PT General Charges $$ ACUTE PT VISIT: 1 Visit         Leita Sable, PT, DPT Acute Rehabilitation Services Secure Chat Preferred Office: [phone]   Leita JONETTA Sable 12/13/2023, 11:47 AM

## 2023-12-13 NOTE — [place] FL2 (Signed)
 [place]  MEDICAID FL2 LEVEL OF CARE FORM     IDENTIFICATION  Patient Name: [name] Birthdate: February 13, 1946 Sex: female Admission Date (Current Location): 12/10/2023  [facility] and [place] Number:  Producer, television/film/video and Address:  [facility] [place], [address] [place], [place] [address]      Provider Number: [id number]  Attending Physician Name and Address:  Christobal Guadalajara, MD  Relative Name and Phone Number:  Vannie Hurl Daughter [phone]    Current Level of Care: Hospital Recommended Level of Care: Skilled Nursing Facility Prior Approval Number:    Date Approved/Denied:   PASRR Number: [id number]  Discharge Plan: SNF    Current Diagnoses: Patient Active Problem List   Diagnosis Date Noted   Closed right hip fracture (HCC) 12/11/2023   Chronic pain syndrome 12/11/2023   Closed right femoral fracture (HCC) 12/11/2023   Medication management 07/09/2014   Anxiety and depression 07/09/2014   Osteoporosis 01/04/2014   GERD (gastroesophageal reflux disease) 01/04/2014   CKD (chronic kidney disease) stage 2, GFR 60-89 ml/min 01/04/2014   Fibromyalgia 01/04/2014   Essential hypertension    Hyperlipidemia    Morbid obesity (BMI 37)     Prediabetes    Vitamin D  deficiency     Orientation RESPIRATION BLADDER Height & Weight     Self, Situation, Time  Normal Continent Weight: 185 lb 3 oz (84 kg) Height:  5' 5 (165.1 cm)  BEHAVIORAL SYMPTOMS/MOOD NEUROLOGICAL BOWEL NUTRITION STATUS        Diet (see discharge summary)  AMBULATORY STATUS COMMUNICATION OF NEEDS Skin   Limited Assist Verbally Surgical wounds, Skin abrasions                       Personal Care Assistance Level of Assistance  Bathing, Feeding, Dressing Bathing Assistance: Limited assistance Feeding assistance: Limited assistance Dressing Assistance: Limited assistance     Functional Limitations Info  Sight, Hearing, Speech Sight Info: Adequate Hearing  Info: Adequate Speech Info: Adequate    SPECIAL CARE FACTORS FREQUENCY  PT (By licensed PT), OT (By licensed OT)     PT Frequency: 5x week OT Frequency: 5x week            Contractures Contractures Info: Not present    Additional Factors Info  Code Status, Allergies Code Status Info: full Allergies Info: Penicillins, Wellbutrin (Bupropion)           Current Medications (12/13/2023):  This is the current hospital active medication list Current Facility-Administered Medications  Medication Dose Route Frequency Provider Last Rate Last Admin   acetaminophen  (TYLENOL ) tablet 650 mg  650 mg Oral Q6H Danton Lauraine LABOR, PA-C   650 mg at 12/13/23 1206   cholecalciferol  (VITAMIN D3) 25 MCG (1000 UNIT) tablet 1,000 Units  1,000 Units Oral Daily Danton Lauraine LABOR, PA-C   1,000 Units at 12/13/23 0940   diphenhydrAMINE  (BENADRYL ) 12.5 MG/5ML elixir 12.5-25 mg  12.5-25 mg Oral Q4H PRN Danton Lauraine LABOR, PA-C       docusate sodium  (COLACE) capsule 100 mg  100 mg Oral BID Danton Lauraine A, PA-C   100 mg at 12/13/23 0940   DULoxetine  (CYMBALTA ) DR capsule 60 mg  60 mg Oral QPM Danton Lauraine LABOR, PA-C   60 mg at 12/12/23 1733   enoxaparin  (LOVENOX ) injection 40 mg  40 mg Subcutaneous Q24H Danton Lauraine A, PA-C   40 mg at 12/13/23 0941   methocarbamol  (ROBAXIN ) tablet 500 mg  500 mg  Oral Q6H PRN Danton Lauraine LABOR, PA-C       Or   methocarbamol  (ROBAXIN ) injection 500 mg  500 mg Intravenous Q6H PRN Danton Lauraine LABOR, PA-C       metoCLOPramide  (REGLAN ) tablet 5-10 mg  5-10 mg Oral Q8H PRN Danton Lauraine LABOR, PA-C       Or   metoCLOPramide  (REGLAN ) injection 5-10 mg  5-10 mg Intravenous Q8H PRN Danton Lauraine LABOR, PA-C       morphine  (PF) 2 MG/ML injection 0.5-1 mg  0.5-1 mg Intravenous Q2H PRN Danton Lauraine LABOR, PA-C   1 mg at 12/13/23 9157   mupirocin  ointment (BACTROBAN ) 2 % 1 Application  1 Application Nasal BID Danton Lauraine LABOR, PA-C   1 Application at 12/13/23 9058   ondansetron  (ZOFRAN ) tablet 4 mg  4  mg Oral Q6H PRN Danton Lauraine LABOR, PA-C       Or   ondansetron  (ZOFRAN ) injection 4 mg  4 mg Intravenous Q6H PRN Danton Lauraine LABOR, PA-C       oxyCODONE  (Oxy IR/ROXICODONE ) immediate release tablet 2.5-5 mg  2.5-5 mg Oral Q4H PRN Danton Lauraine LABOR, PA-C   5 mg at 12/13/23 1206   polyethylene glycol (MIRALAX  / GLYCOLAX ) packet 17 g  17 g Oral Daily PRN Danton Lauraine LABOR, PA-C       pregabalin  (LYRICA ) capsule 50 mg  50 mg Oral QPM Danton Lauraine A, PA-C   50 mg at 12/12/23 1733   sodium chloride  flush (NS) 0.9 % injection 3 mL  3 mL Intravenous Q12H Danton Lauraine LABOR, PA-C   3 mL at 12/13/23 9057   sodium chloride  flush (NS) 0.9 % injection 3 mL  3 mL Intravenous Q12H Danton Lauraine LABOR, PA-C   3 mL at 12/13/23 9058   sodium chloride  flush (NS) 0.9 % injection 3 mL  3 mL Intravenous PRN Danton Lauraine LABOR, PA-C         Discharge Medications: Please see discharge summary for a list of discharge medications.  Relevant Imaging Results:  Relevant Lab Results:   Additional Information SSN: [id number]  Bridget Cordella Simmonds, LCSW

## 2023-12-13 NOTE — Progress Notes (Signed)
 PROGRESS NOTE [name]  FMW:[id number] DOB: 1945/11/22 DOA: 12/10/2023 PCP: Pcp, No  Brief Narrative/Hospital Course: [name] is a 78 y.o. year old female with PMH of  of right-sided hip fracture in March 2025 fixation, essential hypertension, mitral valve regurgitation, chronic pain syndrome, generalized anxiety disorder and depression presented ED after a fall from from the rollator. Seen in the ED with previous history of right hip fracture with fixation in March workup revealed periprosthetic fracture orthopedic consulted and transferred to [facility] for operative intervention.  Subjective: Seen and examined PTOT working with her  Pain controlled Overnight afebrile and BP had been soft 90s-130s on room air  Assessment and plan:   Close right-sided hip fracture Periprosthetic hip fracture status post fall: history of left-sided hip fracture status postrepair in March 2025 presented w/ fall and CT shows oblique fracture through the proximal diaphysis of the right femur surrounding the intramedullary nail and chronic and healing right intertrochanteric fracture. S/p IM rod femur right ORIF 9/4 Continue multimodal pain management, DVT prophylaxis and PT OT as per orthopedics service she is being started on Lovenox  9/5. PTOT working with her and did well and likely plan home w/ hh as she has 24 hr care   Normocytic anemia:  Mild ABLA hemoglobin at 9.7 most likely baseline around 10 g, transfuse less than 7 g Recent Labs  Lab 12/10/23 2219 12/11/23 0326 12/12/23 0442 12/13/23 0509  HGB 12.3 10.7* 10.9* 9.7*  HCT 38.7 34.7* 34.5* 29.9*     Essential hypertension Hyperlipidemia GERD: Not on meds.BP soft to stable   Anxiety and and depression Chronic pain syndrome Fibromyalgia: Continue Lyrica  Cymbalta . She is forgetful about the events of fall post op in PACU but now aa0x3 able to recollect the fall  Class I Obesity w/ Body mass index is 30.82 kg/m.: Will benefit  with PCP follow-up, weight loss,healthy lifestyle and outpatient sleep eval if not done.  Mobility: PT Orders: Active  PT Follow up Rec:     DVT prophylaxis: enoxaparin  (LOVENOX ) injection 40 mg Start: 12/13/23 0800 SCDs Start: 12/12/23 1353 SCDs Start: 12/11/23 0253 Place TED hose Start: 12/11/23 0253 Code Status:   Code Status: Full Code Family Communication: plan of care discussed with patient and her sister  at bedside. Patient status is: Remains hospitalized because of severity of illness Level of care: Med-Surg   Dispo: The patient is from: home w/ family            Anticipated disposition:likely home w/ HH in 24 hrs Objective: Vitals last 24 hrs: Vitals:   12/12/23 2210 12/12/23 2300 12/13/23 0352 12/13/23 0756  BP: (!) 96/49 (!) 92/49 (!) 106/51 134/62  Pulse: 80 77 73 76  Resp: 15     Temp: 99.1 F (37.3 C) 98 F (36.7 C) 98.2 F (36.8 C) 98 F (36.7 C)  TempSrc: Oral Oral  Oral  SpO2: 94% 96% 97% 99%  Weight:      Height:        Physical Examination: General exam: AAOx3 HEENT:Oral mucosa moist, Ear/Nose WNL grossly Respiratory system: Bilaterally clear BS,no use of accessory muscle Cardiovascular system: S1 & S2 +, No JVD. Gastrointestinal system: Abdomen soft,NT,ND, BS+ Nervous System: Alert, awake, moving all extremities,and following commands. Extremities: LE edema neg, distal extremities warm.  Right hip surgical site dressing + c/d/i Skin: No rashes,no icterus. MSK: Normal muscle bulk,tone, power   Medications reviewed:  Scheduled Meds:  acetaminophen   650 mg Oral Q6H   cholecalciferol   1,000 Units Oral Daily   docusate sodium   100 mg Oral BID   DULoxetine   60 mg Oral QPM   enoxaparin  (LOVENOX ) injection  40 mg Subcutaneous Q24H   mupirocin  ointment  1 Application Nasal BID   pregabalin   50 mg Oral QPM   sodium chloride  flush  3 mL Intravenous Q12H   sodium chloride  flush  3 mL Intravenous Q12H   Continuous Infusions:  sodium chloride       Diet: Diet Order             Diet regular Room service appropriate? Yes; Fluid consistency: Thin  Diet effective now                    Data Reviewed: I have personally reviewed following labs and imaging studies ( see epic result tab) CBC: Recent Labs  Lab 12/10/23 2219 12/11/23 0326 12/12/23 0442 12/13/23 0509  WBC 7.6 6.2 6.7 10.5  HGB 12.3 10.7* 10.9* 9.7*  HCT 38.7 34.7* 34.5* 29.9*  MCV 94.9 96.4 97.2 95.5  PLT 264 223 220 192   CMP: Recent Labs  Lab 12/10/23 2320 12/11/23 0326 12/12/23 0442 12/13/23 0509  NA 140 139 139 135  K 3.8 3.9 4.6 4.4  CL 106 108 106 106  CO2 22 22 24 22   GLUCOSE 95 118* 113* 156*  BUN 15 14 16 16   CREATININE 0.66 0.76 1.13* 0.99  CALCIUM  9.4 8.7* 9.3 9.2   GFR: Estimated Creatinine Clearance: 48.5 mL/min (by C-G formula based on SCr of 0.99 mg/dL). No results for input(s): AST, ALT, ALKPHOS, BILITOT, PROT, ALBUMIN in the last 168 hours. No results for input(s): LIPASE, AMYLASE in the last 168 hours. No results for input(s): AMMONIA in the last 168 hours. Coagulation Profile:  Recent Labs  Lab 12/11/23 0326  INR 1.1   Unresulted Labs (From admission, onward)     Start     Ordered   12/19/23 0500  Creatinine, serum  (enoxaparin  (LOVENOX )    CrCl >/= 30 ml/min)  Weekly,   R     Comments: while on enoxaparin  therapy   Question:  Specimen collection method  Answer:  Lab=Lab collect   12/12/23 1352   12/13/23 0500  CBC  Daily,   R     Question:  Specimen collection method  Answer:  Lab=Lab collect   12/12/23 1352   12/13/23 0500  Basic metabolic panel with GFR  Daily,   R     Question:  Specimen collection method  Answer:  Lab=Lab collect   12/12/23 1324   12/13/23 0500  CBC  Daily,   R     Question:  Specimen collection method  Answer:  Lab=Lab collect   12/12/23 1324           Antimicrobials/Microbiology: Anti-infectives (From admission, onward)    Start     Dose/Rate Route Frequency Ordered  Stop   12/12/23 1800  ceFAZolin  (ANCEF ) IVPB 2g/100 mL premix        2 g 200 mL/hr over 30 Minutes Intravenous Every 8 hours 12/12/23 1352 12/13/23 1010   12/12/23 0900  ceFAZolin  (ANCEF ) IVPB 2g/100 mL premix        2 g 200 mL/hr over 30 Minutes Intravenous On call to O.R. 12/12/23 0808 12/12/23 1117   12/12/23 0812  ceFAZolin  (ANCEF ) 2-4 GM/100ML-% IVPB       Note to Pharmacy: Coni Sensor M: cabinet override      12/12/23 0812 12/12/23 1053  No results found for: SDES, SPECREQUEST, CULT, REPTSTATUS  Procedures: Procedure(s) (LRB): INSERTION, INTRAMEDULLARY ROD, FEMUR (Right) REMOVAL, HARDWARE (Right)   Mennie LAMY, MD [facility] 12/13/2023, 10:59 AM

## 2023-12-13 NOTE — Progress Notes (Signed)
 Orthopaedic Trauma Progress Note  SUBJECTIVE: No acute events overnight.  Noted to have soft BP.  Doing okay this morning.  Has not been out of bed yet.  Pain controlled.  OBJECTIVE:  Vitals:   12/13/23 0352 12/13/23 0756  BP: (!) 106/51 134/62  Pulse: 73 76  Resp:    Temp: 98.2 F (36.8 C) 98 F (36.7 C)  SpO2: 97% 99%    Opiates Today (MME): Today's  total administered Morphine  Milligram Equivalents: 10.5 Opiates Yesterday (MME): Yesterday's total administered Morphine  Milligram Equivalents: 76.5  General: Resting comfortably. Respiratory: No increased work of breathing.  Operative Extremity (RLE): Dressing CDI. Sore through thigh as anticipated. Endorses sensation throughout extremity. Neurovascularly intact   IMAGING: Stable post op imaging.   LABS:  Results for orders placed or performed during the hospital encounter of 12/10/23 (from the past 24 hours)  VITAMIN D  25 Hydroxy (Vit-D Deficiency, Fractures)     Status: None   Collection Time: 12/12/23  3:48 PM  Result Value Ref Range   Vit D, 25-Hydroxy 30.93 30 - 100 ng/mL  Basic metabolic panel     Status: Abnormal   Collection Time: 12/13/23  5:09 AM  Result Value Ref Range   Sodium 135 135 - 145 mmol/L   Potassium 4.4 3.5 - 5.1 mmol/L   Chloride 106 98 - 111 mmol/L   CO2 22 22 - 32 mmol/L   Glucose, Bld 156 (H) 70 - 99 mg/dL   BUN 16 8 - 23 mg/dL   Creatinine, Ser 9.00 0.44 - 1.00 mg/dL   Calcium  9.2 8.9 - 10.3 mg/dL   GFR, Estimated 58 (L) >60 mL/min   Anion gap 7 5 - 15  CBC     Status: Abnormal   Collection Time: 12/13/23  5:09 AM  Result Value Ref Range   WBC 10.5 4.0 - 10.5 K/uL   RBC 3.13 (L) 3.87 - 5.11 MIL/uL   Hemoglobin 9.7 (L) 12.0 - 15.0 g/dL   HCT 70.0 (L) 63.9 - 53.9 %   MCV 95.5 80.0 - 100.0 fL   MCH 31.0 26.0 - 34.0 pg   MCHC 32.4 30.0 - 36.0 g/dL   RDW 86.4 88.4 - 84.4 %   Platelets 192 150 - 400 K/uL   nRBC 0.0 0.0 - 0.2 %    ASSESSMENT: [name] is a 78 y.o. female, 1 Day  Post-Op s/p fall  Procedures: REPAIR OF RIGHT PROXIMAL FEMUR NONUNION  CV/Blood loss: Acute blood loss anemia, Hgb 9.7 this AM. Hemodynamically stable  PLAN: Weightbearing: WBAT RLE ROM: Unrestricted ROM Incisional and dressing care: Reinforce dressings as needed  Showering: Okay to be getting incisions wet starting 12/15/2023 Orthopedic device(s): None  Pain management: Continue current regimen VTE prophylaxis: Lovenox , SCDs ID:  Ancef  2gm post op Foley/Lines:  No foley, KVO IVFs Impediments to Fracture Healing: Vitamin D  level low normal at 30, will start supplementation Dispo: PT/OT evaluation today, dispo pending.    D/C recommendations: - Oxycodone , Robaxin , Tylenol  for pain control - Eliquis 2.5 mg twice daily x 30 days for DVT prophylaxis - Continue 1000 units Vit D supplementation daily x 90 days  Follow - up plan: 2 weeks after d/c for wound check and repeat x-rays   Contact information:  Franky Light MD, Lauraine Moores PA-C. After hours and holidays please check [email or web address] for group call information for [facility]   Lauraine PATRIC Moores, PA-C [phone] (office) [email or web address]

## 2023-12-13 NOTE — Evaluation (Signed)
 Occupational Therapy Evaluation Patient Details Name: [name] MRN: [id number] DOB: 04-26-1945 Today's Date: 12/13/2023   History of Present Illness   Pt is an 78 y/o female who presents 12/10/2023 with failed fixation with questionable new fracture of her right proximal femur fracture (originally sustained and fixed in March 2025 at [facility]). 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 Kendal on 12/12/2023. PMH otherwise insignificant to this case.     Clinical Impressions At baseline, pt is largely Independent with ADLs and IADLs and performs functional mobility Mod I with a Rollator. Pt now presents with decreased activity tolerance, pain affecting functional level, decreased balance, and decreased safety and independence with functional tasks. Pt also with impaired cognition, which is baseline per family. Pt currently demonstrates ability to complete ADLs largely with Set up to Mod assist and functional transfers/mobility with a RW with Contact guard assist. Pt participated well in session and is motivated to return to PLOF. Pt will benefit from acute OT services to address deficits and increase safety and independence with functional tasks. Pt and sister report that family is unable to provide 24/7 supervision/assistance at time of discharge. Due to this, OT recommends post acute intensive inpatient skilled rehab services < 3 hours per day to maximize rehab potential.      If plan is discharge home, recommend the following:   A little help with walking and/or transfers;A lot of help with bathing/dressing/bathroom;Assistance with cooking/housework;Direct supervision/assist for medications management;Direct supervision/assist for financial management;Assist for transportation;Help with stairs or ramp for entrance     Functional Status Assessment   Patient has had a recent decline in their functional status and demonstrates the ability to make  significant improvements in function in a reasonable and predictable amount of time.     Equipment Recommendations   BSC/3in1     Recommendations for Other Services         Precautions/Restrictions   Precautions Precautions: Fall Recall of Precautions/Restrictions: Impaired Precaution/Restrictions Comments: Requires frequent cues and repetition of cues at times. Restrictions Weight Bearing Restrictions Per Provider Order: Yes RLE Weight Bearing Per Provider Order: Weight bearing as tolerated     Mobility Bed Mobility               General bed mobility comments: Pt sitting on commode at beginning of session and in recliner at end of session.    Transfers Overall transfer level: Needs assistance Equipment used: Rolling walker (2 wheels) Transfers: Sit to/from Stand, Bed to chair/wheelchair/BSC Sit to Stand: Contact guard assist     Step pivot transfers: Contact guard assist     General transfer comment: VC's for hand placement on seated surface for safety. Hands on guarding for safety as pt powered up to full stand.      Balance Overall balance assessment: Needs assistance Sitting-balance support: Feet supported, No upper extremity supported Sitting balance-Leahy Scale: Fair     Standing balance support: Single extremity supported, Bilateral upper extremity supported, During functional activity, Reliant on assistive device for balance Standing balance-Leahy Scale: Poor                             ADL either performed or assessed with clinical judgement   ADL Overall ADL's : Needs assistance/impaired Eating/Feeding: Set up;Sitting   Grooming: Contact guard assist;Standing   Upper Body Bathing: Supervision/ safety;Set up;Sitting   Lower Body Bathing: Moderate assistance;Sit to/from stand;Cueing for  compensatory techniques   Upper Body Dressing : Contact guard assist;Sitting   Lower Body Dressing: Moderate assistance;Cueing for  compensatory techniques;Sit to/from stand   Toilet Transfer: Contact guard assist;Ambulation;Rolling walker (2 wheels);Regular Toilet;Grab bars;Cueing for safety   Toileting- Clothing Manipulation and Hygiene: Moderate assistance;Sit to/from stand;Cueing for compensatory techniques       Functional mobility during ADLs: Contact guard assist;Rolling walker (2 wheels);Cueing for safety General ADL Comments: Pt with decreased activity tolerance     Vision Patient Visual Report: No change from baseline Vision Assessment?: No apparent visual deficits Additional Comments: Vision [facility] for tasks assessed; not formally screened or assessed     Perception         Praxis         Pertinent Vitals/Pain Pain Assessment Pain Assessment: Faces Faces Pain Scale: Hurts even more Pain Location: R hip Pain Descriptors / Indicators: Operative site guarding, Sore, Grimacing, Discomfort Pain Intervention(s): Limited activity within patient's tolerance, Monitored during session, Repositioned     Extremity/Trunk Assessment Upper Extremity Assessment Upper Extremity Assessment: Right hand dominant;Defer to OT evaluation   Lower Extremity Assessment Lower Extremity Assessment: Defer to PT evaluation RLE Deficits / Details: Acute pain, dereased strength and AROM consistent with above mentioned surgery. RLE: Unable to fully assess due to pain   Cervical / Trunk Assessment Cervical / Trunk Assessment: Kyphotic (mild)   Communication Communication Communication: No apparent difficulties   Cognition Arousal: Alert Behavior During Therapy: WFL for tasks assessed/performed Cognition: Cognition impaired, History of cognitive impairments     Awareness: Intellectual awareness intact, Online awareness intact (Online awareness intermittently intact) Memory impairment (select all impairments): Short-term memory, Working memory Attention impairment (select first level of impairment): Selective  attention Executive functioning impairment (select all impairments): Organization, Problem solving, Reasoning OT - Cognition Comments: Pt AAOx4 and pleasant throughout session. Per pt's sister, pt has a diagnosis of dementia and pt's daughter is her POA. Sister reports pt cognition is at baseline.                 Following commands: Intact       Cueing  General Comments   Cueing Techniques: Verbal cues;Gestural cues      Exercises     Shoulder Instructions      Home Living Family/patient expects to be discharged to:: Private residence Living Arrangements: Alone Available Help at Discharge: Family;Available PRN/intermittently (One daughter (who is POA) lives in [place] and works. One daughter lives in [place] and has health issues that prevent her from being able from providing assistance. Sister can provide PRN assistance.) Type of Home: Mobile home Home Access: Stairs to enter Entrance Stairs-Number of Steps: 5 (at front door; 3-4 at back door) Entrance Stairs-Rails: Right;Left;Can reach both (at both doors) Home Layout: One level     Bathroom Shower/Tub: Producer, television/film/video: Standard Bathroom Accessibility: Yes How Accessible: Accessible via walker Home Equipment: Rollator (4 wheels);Rolling Walker (2 wheels);Grab bars - tub/shower;Hand held shower head;Shower seat - built in;Cane - single point   Additional Comments: Pt reports she plans to install grab bars near toilet soon.      Prior Functioning/Environment Prior Level of Function : Independent/Modified Independent;History of Falls (last six months)             Mobility Comments: Uses the rollator ADLs Comments: Indepedent with ADLs and most IADLs. Pt's sister reports family checsks on pt. OT uncertain of what this entails.    OT Problem List: Decreased activity tolerance;Impaired balance (sitting  and/or standing);Decreased knowledge of use of DME or AE;Decreased safety awareness    OT Treatment/Interventions: Therapeutic exercise;Self-care/ADL training;Energy conservation;DME and/or AE instruction;Therapeutic activities;Cognitive remediation/compensation;Patient/family education;Balance training      OT Goals(Current goals can be found in the care plan section)   Acute Rehab OT Goals Patient Stated Goal: to go to short-term rehab and then return home OT Goal Formulation: With patient/family Time For Goal Achievement: 12/27/23 Potential to Achieve Goals: Good ADL Goals Pt Will Perform Grooming: with modified independence;standing Pt Will Perform Lower Body Bathing: sit to/from stand;with contact guard assist Pt Will Perform Lower Body Dressing: with contact guard assist;sit to/from stand Pt Will Transfer to Toilet: with modified independence;ambulating;regular height toilet (with least restrictive AD) Pt Will Perform Toileting - Clothing Manipulation and hygiene: with supervision;sit to/from stand   OT Frequency:  Min 2X/week    Co-evaluation              AM-PAC OT 6 Clicks Daily Activity     Outcome Measure Help from another person eating meals?: A Little Help from another person taking care of personal grooming?: A Little Help from another person toileting, which includes using toliet, bedpan, or urinal?: A Lot Help from another person bathing (including washing, rinsing, drying)?: A Lot Help from another person to put on and taking off regular upper body clothing?: A Little Help from another person to put on and taking off regular lower body clothing?: A Lot 6 Click Score: 15   End of Session Equipment Utilized During Treatment: Rolling walker (2 wheels);Gait belt Nurse Communication: Mobility status;Other (comment) (Pt's IV beeping)  Activity Tolerance: Patient tolerated treatment well Patient left: in chair;with call bell/phone within reach;with family/visitor present  OT Visit Diagnosis: Unsteadiness on feet (R26.81);Other abnormalities of  gait and mobility (R26.89);History of falling (Z91.81);Other symptoms and signs involving cognitive function                Time: 1100-1137 OT Time Calculation (min): 37 min Charges:  OT General Charges $OT Visit: 1 Visit OT Evaluation $OT Eval Moderate Complexity: 1 Mod OT Treatments $Self Care/Home Management : 8-22 mins  Margarie Rockey HERO., OTR/L, MA Acute Rehab [phone]   Margarie FORBES Horns 12/13/2023, 12:15 PM

## 2023-12-13 NOTE — Plan of Care (Signed)

## 2023-12-13 NOTE — Progress Notes (Signed)
 Mobility Specialist Progress Note:    12/13/23 1509  Mobility  Activity Ambulated with assistance (In hallway)  Level of Assistance Contact guard assist, steadying assist  Assistive Device Front wheel walker  Distance Ambulated (ft) 150 ft  RLE Weight Bearing Per Provider Order WBAT  Activity Response Tolerated well  Mobility Referral Yes  Mobility visit 1 Mobility  Mobility Specialist Start Time (ACUTE ONLY) 1447  Mobility Specialist Stop Time (ACUTE ONLY) 1458  Mobility Specialist Time Calculation (min) (ACUTE ONLY) 11 min   Received pt in chair and agreeable to mobility. No physical assistance needed. C/o RLE pain, otherwise tolerated well. Returned to room without fault. Left pt in chair. Personal belongings and call light within reach. All needs met. Family present.  Lavanda Pollack Mobility Specialist  Please contact via [employer] or  Rehab Office [phone]

## 2023-12-13 NOTE — TOC Initial Note (Signed)
 Transition of Care [facility]) - Initial/Assessment Note    Patient Details  Name: [name] MRN: [id number] Date of Birth: 1945/11/09  Transition of [facility]) CM/SW Contact:    Bridget Cordella Simmonds, LCSW Phone Number: 12/13/2023, 3:36 PM  Clinical Narrative:   CSW met with pt and daughter Tillman regarding PT recommendation for SNF.    Permission given to speak with Tillman, who is also POA.  Pt from home with her other daughter (not listed) who is disabled, pt requests all communication be with [place].  They are agreeable to SNF, requesting [facility].  Permission given to send out referral in the hub.  Referral sent out in hub. CSW reached out to [facility] to review.               Expected Discharge Plan: Skilled Nursing Facility Barriers to Discharge: Continued Medical Work up, SNF Pending bed offer   Patient Goals and CMS Choice Patient states their goals for this hospitalization and ongoing recovery are:: be like I was   Choice offered to / list presented to : Patient, Adult Children (pt and daughter Tillman requesting [facility])      Expected Discharge Plan and Services In-house Referral: Clinical Social Work   Post Acute Care Choice: Skilled Nursing Facility Living arrangements for the past 2 months: Single Family Home                                      Prior Living Arrangements/Services Living arrangements for the past 2 months: Single Family Home Lives with:: Adult Children Patient language and need for interpreter reviewed:: Yes Do you feel safe going back to the place where you live?: Yes      Need for Family Participation in Patient Care: Yes (Comment) Care giver support system in place?: Yes (comment) Current home services: Other (comment) (none) Criminal Activity/Legal Involvement Pertinent to Current Situation/Hospitalization: No - Comment as needed  Activities of Daily Living   ADL Screening (condition at time of  admission) Independently performs ADLs?: Yes (appropriate for developmental age) Is the patient deaf or have difficulty hearing?: No Does the patient have difficulty seeing, even when wearing glasses/contacts?: No Does the patient have difficulty concentrating, remembering, or making decisions?: No  Permission Sought/Granted Permission sought to share information with : Family Supports Permission granted to share information with : Yes, Verbal Permission Granted  Share Information with NAME: daughter Tillman  Permission granted to share info w AGENCY: SNF        Emotional Assessment Appearance:: Appears stated age Attitude/Demeanor/Rapport: Engaged Affect (typically observed): Appropriate, Pleasant Orientation: : Oriented to Self, Oriented to  Time, Oriented to Situation      Admission diagnosis:  Closed right femoral fracture (HCC) [S72.91XA] Patient Active Problem List   Diagnosis Date Noted   Closed right hip fracture (HCC) 12/11/2023   Chronic pain syndrome 12/11/2023   Closed right femoral fracture (HCC) 12/11/2023   Medication management 07/09/2014   Anxiety and depression 07/09/2014   Osteoporosis 01/04/2014   GERD (gastroesophageal reflux disease) 01/04/2014   CKD (chronic kidney disease) stage 2, GFR 60-89 ml/min 01/04/2014   Fibromyalgia 01/04/2014   Essential hypertension    Hyperlipidemia    Morbid obesity (BMI 37)     Prediabetes    Vitamin D  deficiency    PCP:  Pcp, No Pharmacy:   [id number] [place], [place] - [address]  [address] [place] [address] Phone: [phone] Fax: [phone]  [facility] - [place], [place] - [address] [place] [address] Phone: [phone] Fax: [phone]  [id number] [place], [place] - [address] [facility] [address] [place] [address] Phone: [phone] Fax: [phone]     Social  Drivers of Health (SDOH) Social History: SDOH Screenings   Food Insecurity: No Food Insecurity (12/11/2023)  Housing: Low Risk  (12/11/2023)  Transportation Needs: No Transportation Needs (12/11/2023)  Utilities: Not At Risk (12/11/2023)  Financial Resource Strain: Low Risk  (06/12/2023)   Received from [facility]  Social Connections: Unknown (12/11/2023)  Tobacco Use: Medium Risk (12/12/2023)   SDOH Interventions:     Readmission Risk Interventions     No data to display

## 2023-12-13 NOTE — Discharge Instructions (Signed)
 Orthopaedic Trauma Service Discharge Instructions   General Discharge Instructions  WEIGHT BEARING STATUS: Weightbearing as tolerated  RANGE OF MOTION/ACTIVITY: Unrestricted range of motion to the hip and knee  Wound Care: You may remove your surgical dressing. Incisions can be left open to air if there is no drainage. Once the incision is completely dry and without drainage, it may be left open to air out.  Showering may begin postoperative day #3.  Clean incision gently with soap and water.  DVT/PE prophylaxis: Eliquis 2.5 mg twice daily x 30 days  Diet: as you were eating previously.  Can use over the counter stool softeners and bowel preparations, such as Miralax , to help with bowel movements.  Narcotics can be constipating.  Be sure to drink plenty of fluids  PAIN MEDICATION USE AND EXPECTATIONS  You have likely been given narcotic medications to help control your pain.  After a traumatic event that results in an fracture (broken bone) with or without surgery, it is ok to use narcotic pain medications to help control one's pain.  We understand that everyone responds to pain differently and each individual patient will be evaluated on a regular basis for the continued need for narcotic medications. Ideally, narcotic medication use should last no more than 6-8 weeks (coinciding with fracture healing).   As a patient it is your responsibility as well to monitor narcotic medication use and report the amount and frequency you use these medications when you come to your office visit.   We would also advise that if you are using narcotic medications, you should take a dose prior to therapy to maximize you participation.  IF YOU ARE ON NARCOTIC MEDICATIONS IT IS NOT PERMISSIBLE TO OPERATE A MOTOR VEHICLE (MOTORCYCLE/CAR/TRUCK/MOPED) OR HEAVY MACHINERY DO NOT MIX NARCOTICS WITH OTHER CNS (CENTRAL NERVOUS SYSTEM) DEPRESSANTS SUCH AS ALCOHOL  POST-OPERATIVE OPIOID TAPER INSTRUCTIONS: It is  important to wean off of your opioid medication as soon as possible. If you do not need pain medication after your surgery it is ok to stop day one. Opioids include: Codeine, Hydrocodone (Norco, Vicodin), Oxycodone (Percocet, oxycontin ) and hydromorphone  amongst others.  Long term and even short term use of opiods can cause: Increased pain response Dependence Constipation Depression Respiratory depression And more.  Withdrawal symptoms can include Flu like symptoms Nausea, vomiting And more Techniques to manage these symptoms Hydrate well Eat regular healthy meals Stay active Use relaxation techniques(deep breathing, meditating, yoga) Do Not substitute Alcohol to help with tapering If you have been on opioids for less than two weeks and do not have pain than it is ok to stop all together.  Plan to wean off of opioids This plan should start within one week post op of your fracture surgery  Maintain the same interval or time between taking each dose and first decrease the dose.  Cut the total daily intake of opioids by one tablet each day Next start to increase the time between doses. The last dose that should be eliminated is the evening dose.    STOP SMOKING OR USING NICOTINE PRODUCTS!!!!  As discussed nicotine severely impairs your body's ability to heal surgical and traumatic wounds but also impairs bone healing.  Wounds and bone heal by forming microscopic blood vessels (angiogenesis) and nicotine is a vasoconstrictor (essentially, shrinks blood vessels).  Therefore, if vasoconstriction occurs to these microscopic blood vessels they essentially disappear and are unable to deliver necessary nutrients to the healing tissue.  This is one modifiable factor that you can do  to dramatically increase your chances of healing your injury.  (This means no smoking, no nicotine gum, patches, etc)  DO NOT USE NONSTEROIDAL ANTI-INFLAMMATORY DRUGS (NSAID'S)  Using products such as Advil  (ibuprofen), Aleve (naproxen), Motrin (ibuprofen) for additional pain control during fracture healing can delay and/or prevent the healing response.  If you would like to take over the counter (OTC) medication, Tylenol  (acetaminophen ) is ok.  However, some narcotic medications that are given for pain control contain acetaminophen  as well. Therefore, you should not exceed more than 4000 mg of tylenol  in a day if you do not have liver disease.  Also note that there are may OTC medicines, such as cold medicines and allergy medicines that my contain tylenol  as well.  If you have any questions about medications and/or interactions please ask your doctor/PA or your pharmacist.      ICE AND ELEVATE INJURED/OPERATIVE EXTREMITY  Using ice and elevating the injured extremity above your heart can help with swelling and pain control.  Icing in a pulsatile fashion, such as 20 minutes on and 20 minutes off, can be followed.    Do not place ice directly on skin. Make sure there is a barrier between to skin and the ice pack.    Using frozen items such as frozen peas works well as the conform nicely to the are that needs to be iced.  USE AN ACE WRAP OR TED HOSE FOR SWELLING CONTROL  In addition to icing and elevation, Ace wraps or TED hose are used to help limit and resolve swelling.  It is recommended to use Ace wraps or TED hose until you are informed to stop.    When using Ace Wraps start the wrapping distally (farthest away from the body) and wrap proximally (closer to the body)   Example: If you had surgery on your leg or thing and you do not have a splint on, start the ace wrap at the toes and work your way up to the thigh        If you had surgery on your upper extremity and do not have a splint on, start the ace wrap at your fingers and work your way up to the upper arm  IF YOU ARE IN A SPLINT OR CAST DO NOT REMOVE IT FOR ANY REASON   If your splint gets wet for any reason please contact the office  immediately. You may shower in your splint or cast as long as you keep it dry.  This can be done by wrapping in a cast cover or garbage back (or similar)  Do Not stick any thing down your splint or cast such as pencils, money, or hangers to try and scratch yourself with.  If you feel itchy take benadryl  as prescribed on the bottle for itching  IF YOU ARE IN A CAM BOOT (BLACK BOOT)  You may remove boot periodically. Perform daily dressing changes as noted below.  Wash the liner of the boot regularly and wear a sock when wearing the boot. It is recommended that you sleep in the boot until told otherwise   CALL THE OFFICE FOR MEDICATION REFILLS OR WITH ANY QUESTIONS/CONCERNS: [phone]   VISIT OUR WEBSITE FOR ADDITIONAL INFORMATION: [email or web address]    Discharge Pin Site Instructions  Dress pins daily with Kerlix roll starting on POD 2. Wrap the Kerlix so that it tamps the skin down around the pin-skin interface to prevent/limit motion of the skin relative to the pin.  (Pin-skin motion is  the primary cause of pain and infection related to external fixator pin sites).  Remove any crust or coagulum that may obstruct drainage with a saline moistened gauze or soap and water.  After POD 3, if there is no discernable drainage on the pin site dressing, the interval for change can by increased to every other day.  You may shower with the fixator, cleaning all pin sites gently with soap and water.  If you have a surgical wound this needs to be completely dry and without drainage before showering.  The extremity can be lifted by the fixator to facilitate wound care and transfers.  Notify the office/Doctor if you experience increasing drainage, redness, or pain from a pin site, or if you notice purulent (thick, snot-like) drainage.   Discharge Wound Care Instructions  Do NOT apply any ointments, solutions or lotions to pin sites or surgical wounds.  These prevent needed drainage and even though  solutions like hydrogen peroxide kill bacteria, they also damage cells lining the pin sites that help fight infection.  Applying lotions or ointments can keep the wounds moist and can cause them to breakdown and open up as well. This can increase the risk for infection. When in doubt call the office.  Surgical incisions should be dressed daily.  If any drainage is noted, use one layer of adaptic or Mepitel, then gauze, Kerlix, and an ace wrap. - These dressing supplies should be available at local medical supply stores ([facility], [facility], etc) as well as Insurance claims handler ([facility], [facility], [facility], etc)  Once the incision is completely dry and without drainage, it may be left open to air out.  Showering may begin 36-48 hours later.  Cleaning gently with soap and water.  Traumatic wounds should be dressed daily as well.    One layer of adaptic, gauze, Kerlix, then ace wrap.  The adaptic can be discontinued once the draining has ceased    If you have a wet to dry dressing: wet the gauze with saline the squeeze as much saline out so the gauze is moist (not soaking wet), place moistened gauze over wound, then place a dry gauze over the moist one, followed by Kerlix wrap, then ace wrap.    Call office for the following: Temperature greater than 101F Persistent nausea and vomiting Severe uncontrolled pain Redness, tenderness, or signs of infection (pain, swelling, redness, odor or green/yellow discharge around the site) Difficulty breathing, headache or visual disturbances Hives Persistent dizziness or light-headedness Extreme fatigue Any other questions or concerns you may have after discharge  In an emergency, call 911 or go to an Emergency Department at a nearby hospital  OTHER HELPFUL INFORMATION  If you had a block, it will wear off between 8-24 hrs postop typically.  This is period when your pain may go from nearly zero to the pain you would have had postop without the  block.  This is an abrupt transition but nothing dangerous is happening.  You may take an extra dose of narcotic when this happens.  You should wean off your narcotic medicines as soon as you are able.  Most patients will be off or using minimal narcotics before their first postop appointment.   We suggest you use the pain medication the first night prior to going to bed, in order to ease any pain when the anesthesia wears off. You should avoid taking pain medications on an empty stomach as it will make you nauseous.  Do not drink alcoholic beverages  or take illicit drugs when taking pain medications.  In most states it is against the law to drive while you are in a splint or sling.  And certainly against the law to drive while taking narcotics.  You may return to work/school in the next couple of days when you feel up to it.   Pain medication may make you constipated.  Below are a few solutions to try in this order: Decrease the amount of pain medication if you aren't having pain. Drink lots of decaffeinated fluids. Drink prune juice and/or each dried prunes  If the first 3 don't work start with additional solutions Take Colace - an over-the-counter stool softener Take Senokot - an over-the-counter laxative Take Miralax  - a stronger over-the-counter laxative

## 2023-12-14 DIAGNOSIS — S72001A Fracture of unspecified part of neck of right femur, initial encounter for closed fracture: Secondary | ICD-10-CM | POA: Diagnosis not present

## 2023-12-14 LAB — BASIC METABOLIC PANEL WITH GFR
Anion gap: 11 (ref 5–15)
BUN: 14 mg/dL (ref 8–23)
CO2: 23 mmol/L (ref 22–32)
Calcium: 9.3 mg/dL (ref 8.9–10.3)
Chloride: 106 mmol/L (ref 98–111)
Creatinine, Ser: 0.86 mg/dL (ref 0.44–1.00)
GFR, Estimated: >60 mL/min (ref >60)
Glucose, Bld: 104 mg/dL — ABNORMAL HIGH (ref 70–99)
Potassium: 4.3 mmol/L (ref 3.5–5.1)
Sodium: 140 mmol/L (ref 135–145)

## 2023-12-14 LAB — CBC
HCT: 32.2 % — ABNORMAL LOW (ref 36.0–46.0)
Hemoglobin: 10.4 g/dL — ABNORMAL LOW (ref 12.0–15.0)
MCH: 31 pg (ref 26.0–34.0)
MCHC: 32.3 g/dL (ref 30.0–36.0)
MCV: 95.8 fL (ref 80.0–100.0)
Platelets: 229 K/uL (ref 150–400)
RBC: 3.36 MIL/uL — ABNORMAL LOW (ref 3.87–5.11)
RDW: 13.8 % (ref 11.5–15.5)
WBC: 9.5 K/uL (ref 4.0–10.5)
nRBC: 0 % (ref 0.0–0.2)

## 2023-12-14 MED ORDER — OXYCODONE HCL 5 MG PO TABS: 2.5000 mg | ORAL_TABLET | ORAL | 0 refills | Status: DC | PRN

## 2023-12-14 MED ORDER — METHOCARBAMOL 500 MG PO TABS: 500.0000 mg | ORAL_TABLET | Freq: Four times a day (QID) | ORAL | 0 refills | Status: DC | PRN

## 2023-12-14 MED ORDER — ACETAMINOPHEN 325 MG PO TABS: 650.0000 mg | ORAL_TABLET | Freq: Four times a day (QID) | ORAL | 0 refills | Status: AC

## 2023-12-14 MED ORDER — APIXABAN 2.5 MG PO TABS: 2.5000 mg | ORAL_TABLET | Freq: Two times a day (BID) | ORAL | 0 refills | Status: AC

## 2023-12-14 NOTE — Progress Notes (Signed)
 Physical Therapy Treatment Patient Details Name: [name] MRN: [id number] DOB: April 26, 1945 Today's Date: 12/14/2023   History of Present Illness Pt is an 78 y/o female who presents 12/10/2023 with failed fixation with questionable new fracture of her right proximal femur fracture (originally sustained and fixed in March 2025 at [facility]). 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 Kendal on 12/12/2023. PMH essential hypertension, memory deficit (per family), mitral valve regurgitation, chronic pain syndrome, generalized anxiety disorder and depression.    PT Comments  Pt received in chair, sleeping but easily awoken, pt with decreased short term memory and needing lots of instruction on improved body mechanics and sequencing steps with RW for less discomfort. Pt with good effort and tolerance for transfer, gait and stair negotiation. Pt needing up to minA with mod cues for stepping up/down standard height step with BUE support and mostly CGA with dense cues for gait and transfer training to/from slightly elevated surface heights. Patient will benefit from continued inpatient follow up therapy, <3 hours/day to return to PLOF.    If plan is discharge home, recommend the following: A little help with walking and/or transfers;A little help with bathing/dressing/bathroom;Assistance with cooking/housework;Assist for transportation;Help with stairs or ramp for entrance;Supervision due to cognitive status   Can travel by private vehicle     Yes  Equipment Recommendations  BSC/3in1    Recommendations for Other Services       Precautions / Restrictions Precautions Precautions: Fall Recall of Precautions/Restrictions: Impaired Precaution/Restrictions Comments: decreased carryover of some safety precs Restrictions Weight Bearing Restrictions Per Provider Order: Yes RLE Weight Bearing Per Provider Order: Weight bearing as tolerated     Mobility  Bed Mobility                General bed mobility comments: received in chair, back in chair at end of session    Transfers Overall transfer level: Needs assistance Equipment used: Rolling walker (2 wheels) Transfers: Sit to/from Stand, Bed to chair/wheelchair/BSC Sit to Stand: Contact guard assist   Step pivot transfers: Contact guard assist, From elevated surface       General transfer comment: chair with arm rests>RW, RW<>mat table (elevated) in PT gym, BSC<>RW and in recliner chair at end of session. Pt instructed via multimodal cues for RLE placement before/after transfer for reduced pain, fair carryover for cues for safe UE placement with transfers ~50% carryover.    Ambulation/Gait Ambulation/Gait assistance: Min assist, Contact guard assist Gait Distance (Feet): 50 Feet (x2 with seated rest break) Assistive device: Rolling walker (2 wheels) Gait Pattern/deviations: Step-to pattern, Step-through pattern, Decreased stance time - right, Decreased step length - right, Decreased step length - left, Trunk flexed Gait velocity: Decreased     General Gait Details: VC's for improved posture, closer walker proximity and forward gaze. Still with decreased R heel strike but better RLE WB tolerance and stability with dense cues for step-to pattern. Lower RW obtained after first gait trial and pt reporting more comfort with less bil shoulder hunching with youth height RW used.   Stairs Stairs: Yes Stairs assistance: Min assist Stair Management: Two rails, Step to pattern, Forwards Number of Stairs: 2 General stair comments: pt ascended/descended 3 curb step x5 reps with BUE support to simulate home entry threshold step, no buckling or LOB but needs dense cues and consistent CGA for safety, pt needing PTA to lift RW up onto step for support. With standard height steps x2 in PT gym,  pt needing minA for descent with step-to pattern and frequent cues for proper sequencing, pt recalling ~50% of the time  proper sequencing after initial instruction.   Wheelchair Mobility     Tilt Bed    Modified Rankin (Stroke Patients Only)       Balance Overall balance assessment: Needs assistance Sitting-balance support: Feet supported, No upper extremity supported Sitting balance-Leahy Scale: Fair     Standing balance support: Single extremity supported, Bilateral upper extremity supported, During functional activity, Reliant on assistive device for balance Standing balance-Leahy Scale: Poor Standing balance comment: RW reliant                            Communication Communication Communication: No apparent difficulties  Cognition Arousal: Alert Behavior During Therapy: WFL for tasks assessed/performed   PT - Cognitive impairments: History of cognitive impairments, Safety/Judgement, Sequencing, Memory                       PT - Cognition Comments: Pt received trying to decide what to order for lunch, PTA encouraging her to order and PTA to return after working with another patient. 30 mins later, pt sleeping with menu on her chest, reports she forgot to call in lunch order, PTA dialing number for cafeteria and handing it to pt to order as PTA typing a note outside room. When PTA returned to room, pt again fallen asleep, but reports she thinks she placed her order. Pt did well with Delayed Recall strategy for stair sequencing and instruction on step-to pattern with RW for pain control with gait. Following commands: Intact      Cueing Cueing Techniques: Verbal cues, Gestural cues, Tactile cues  Exercises General Exercises - Lower Extremity Ankle Circles/Pumps: AROM, Both, Supine, Seated, 20 reps (X10 EA posture) Long Arc Quad: AROM, Both, 10 reps, Seated (x2 sets with rest break between) Hip Flexion/Marching: AROM, Right, 10 reps, Standing (prior to ambulation, cues for knee lower than hip for pain control) Other Exercises Other Exercises: 3 step-ups and down x5 reps  for BLE strengthening prior to full stair training    General Comments General comments (skin integrity, edema, etc.): HR 76 bpm seated and to 88 bpm with standing tasks; no acute s/sx distress or dizziness with postural changes. Pt aware to remove ice in 10-20 mins or take off if too cold.      Pertinent Vitals/Pain Pain Assessment Pain Assessment: Faces Faces Pain Scale: Hurts even more Pain Location: R hip with ambulation; less at rest Pain Descriptors / Indicators: Operative site guarding, Sore, Grimacing, Discomfort Pain Intervention(s): Monitored during session, Premedicated before session, Repositioned, Ice applied    Home Living                          Prior Function            PT Goals (current goals can now be found in the care plan section) Acute Rehab PT Goals Patient Stated Goal: [place] Goal Formulation: With patient/family Time For Goal Achievement: 12/20/23 Progress towards PT goals: Progressing toward goals    Frequency    Min 2X/week      PT Plan      Co-evaluation              AM-PAC PT 6 Clicks Mobility   Outcome Measure  Help needed  turning from your back to your side while in a flat bed without using bedrails?: A Little Help needed moving from lying on your back to sitting on the side of a flat bed without using bedrails?: A Little Help needed moving to and from a bed to a chair (including a wheelchair)?: A Little Help needed standing up from a chair using your arms (e.g., wheelchair or bedside chair)?: A Little Help needed to walk in hospital room?: A Lot (mod safety/sequencing cues) Help needed climbing 3-5 steps with a railing? : A Lot (mod safety/sequencing cues) 6 Click Score: 16    End of Session Equipment Utilized During Treatment: Gait belt Activity Tolerance: Patient tolerated treatment well;Patient limited by pain Patient left: in chair;with call bell/phone within reach;with  chair alarm set Nurse Communication: Mobility status;Patient requests pain meds PT Visit Diagnosis: Unsteadiness on feet (R26.81);Pain Pain - Right/Left: Right Pain - part of body: Hip     Time: [id number] PT Time Calculation (min) (ACUTE ONLY): 29 min  Charges:    $Gait Training: 8-22 mins $Therapeutic Exercise: 8-22 mins PT General Charges $$ ACUTE PT VISIT: 1 Visit                     Verdun Rackley P., PTA Acute Rehabilitation Services Secure Chat Preferred 9a-5:30pm Office: [phone]    Connell HERO [facility] 12/14/2023, 2:04 PM

## 2023-12-14 NOTE — Progress Notes (Signed)
 PROGRESS NOTE [name]  FMW:[id number] DOB: 02/22/1946 DOA: 12/10/2023 PCP: Pcp, No  Brief Narrative/Hospital Course: [name] is a 78 y.o. year old female with PMH of  of right-sided hip fracture in March 2025 fixation, essential hypertension, mitral valve regurgitation, chronic pain syndrome, generalized anxiety disorder and depression presented ED after a fall from from the rollator. Seen in the ED with previous history of right hip fracture with fixation in March workup revealed periprosthetic fracture orthopedic consulted and transferred to [facility] for operative intervention. S/p S/p IM rod femur right ORIF 9/4 PT OT following recommending skilled nursing facility.  Subjective: Seen and examined Resting on bedside chair pain controlled Ortho PA at bedside Overnight afebrile BP stable labs shows hemoglobin improving 10.4 g  Assessment and plan:   Close right-sided hip fracture Periprosthetic hip fracture status post fall: history of left-sided hip fracture status postrepair in March 2025 presented w/ fall and CT shows oblique fracture through the proximal diaphysis of the right femur surrounding the intramedullary nail and chronic and healing right intertrochanteric fracture. S/p IM rod femur right ORIF 9/4 Continue multimodal pain management, DVT prophylaxis and PT OT as per orthopedics service Now on Lovenox  from 9/5> on dc plan for eliquis  for vte prophylaxis.[name] PTOT recommending skilled nursing facility due to her living situation,?  Early dementia as per daughter    Normocytic anemia:  Mild ABLA hemoglobin at 9.7 most likely baseline around 10 g, transfuse less than 7 g.  Once stable Recent Labs  Lab 12/10/23 2219 12/11/23 0326 12/12/23 0442 12/13/23 0509 12/14/23 0548  HGB 12.3 10.7* 10.9* 9.7* 10.4*  HCT 38.7 34.7* 34.5* 29.9* 32.2*     Essential hypertension Hyperlipidemia GERD: Not on meds.BP soft.  Supportive care.   Anxiety and and  depression Chronic pain syndrome Fibromyalgia: Mood stable, continue Lyrica  Cymbalta . She is forgetful about the events of fall post op in PACU but now able to recollect, daughter indicates she possibly has early dementia but she is mostly alert awake oriented interactive communicative and appropriate   Class I Obesity w/ Body mass index is 30.82 kg/m.: Will benefit with PCP follow-up, weight loss,healthy lifestyle and outpatient sleep eval if not done.  Mobility: PT Orders: Active  PT Follow up Rec: Skilled Nursing-Short Term Rehab (<3 Hours/Day)12/13/2023 1000    DVT prophylaxis: enoxaparin  (LOVENOX ) injection 40 mg Start: 12/13/23 0800 SCDs Start: 12/12/23 1353 SCDs Start: 12/11/23 0253 Place TED hose Start: 12/11/23 0253 Code Status:   Code Status: Full Code Family Communication: plan of care discussed with patient and her sister  at bedside.  I had called and updated her daughter 9/5 and she is requesting SNF placement for rehab given her history Patient status is: Remains hospitalized because of severity of illness Level of care: Med-Surg   Dispo: The patient is from: home w/ family            Anticipated disposition: SNF once available . She is medically stable.  Objective: Vitals last 24 hrs: Vitals:   12/13/23 1516 12/13/23 1928 12/14/23 0403 12/14/23 0730  BP: (!) 119/43 (!) 105/47 121/61 108/71  Pulse: 73 81 79 75  Resp: 18 13 18 18   Temp: 98 F (36.7 C) 97.9 F (36.6 C) 98.3 F (36.8 C) 98.4 F (36.9 C)  TempSrc:  Oral  Oral  SpO2: 96% 97% 96% 98%  Weight:      Height:        Physical Examination: General exam: AAOx3 HEENT:Oral mucosa moist,  Ear/Nose WNL grossly Respiratory system: Bilaterally clear BS,no use of accessory muscle Cardiovascular system: S1 & S2 +, No JVD. Gastrointestinal system: Abdomen soft,NT,ND, BS+ Nervous System: Alert, awake, moving all extremities,and following commands. Extremities: LE edema neg, distal extremities warm.  Right hip  surgical site clean with dressing intact Skin: No rashes,no icterus. MSK: Normal muscle bulk,tone, power   Medications reviewed:  Scheduled Meds:  acetaminophen   650 mg Oral Q6H   cholecalciferol   1,000 Units Oral Daily   docusate sodium   100 mg Oral BID   DULoxetine   60 mg Oral QPM   enoxaparin  (LOVENOX ) injection  40 mg Subcutaneous Q24H   mupirocin  ointment  1 Application Nasal BID   pregabalin   50 mg Oral QPM   sodium chloride  flush  3 mL Intravenous Q12H   sodium chloride  flush  3 mL Intravenous Q12H   Continuous Infusions:   Diet: Diet Order             Diet regular Room service appropriate? Yes; Fluid consistency: Thin  Diet effective now                    Data Reviewed: I have personally reviewed following labs and imaging studies ( see epic result tab) CBC: Recent Labs  Lab 12/10/23 2219 12/11/23 0326 12/12/23 0442 12/13/23 0509 12/14/23 0548  WBC 7.6 6.2 6.7 10.5 9.5  HGB 12.3 10.7* 10.9* 9.7* 10.4*  HCT 38.7 34.7* 34.5* 29.9* 32.2*  MCV 94.9 96.4 97.2 95.5 95.8  PLT 264 223 220 192 229   CMP: Recent Labs  Lab 12/10/23 2320 12/11/23 0326 12/12/23 0442 12/13/23 0509 12/14/23 0548  NA 140 139 139 135 140  K 3.8 3.9 4.6 4.4 4.3  CL 106 108 106 106 106  CO2 22 22 24 22 23   GLUCOSE 95 118* 113* 156* 104*  BUN 15 14 16 16 14   CREATININE 0.66 0.76 1.13* 0.99 0.86  CALCIUM  9.4 8.7* 9.3 9.2 9.3   GFR: Estimated Creatinine Clearance: 55.8 mL/min (by C-G formula based on SCr of 0.86 mg/dL). No results for input(s): AST, ALT, ALKPHOS, BILITOT, PROT, ALBUMIN in the last 168 hours. No results for input(s): LIPASE, AMYLASE in the last 168 hours. No results for input(s): AMMONIA in the last 168 hours. Coagulation Profile:  Recent Labs  Lab 12/11/23 0326  INR 1.1   Unresulted Labs (From admission, onward)     Start     Ordered   12/19/23 0500  Creatinine, serum  (enoxaparin  (LOVENOX )    CrCl >/= 30 ml/min)  Weekly,   R      Comments: while on enoxaparin  therapy   Question:  Specimen collection method  Answer:  Lab=Lab collect   12/12/23 1352   12/13/23 0500  Basic metabolic panel with GFR  Daily,   R     Question:  Specimen collection method  Answer:  Lab=Lab collect   12/12/23 1324   12/13/23 0500  CBC  Daily,   R     Question:  Specimen collection method  Answer:  Lab=Lab collect   12/12/23 1324           Antimicrobials/Microbiology: Anti-infectives (From admission, onward)    Start     Dose/Rate Route Frequency Ordered Stop   12/12/23 1800  ceFAZolin  (ANCEF ) IVPB 2g/100 mL premix        2 g 200 mL/hr over 30 Minutes Intravenous Every 8 hours 12/12/23 1352 12/13/23 1010   12/12/23 0900  ceFAZolin  (ANCEF ) IVPB 2g/100  mL premix        2 g 200 mL/hr over 30 Minutes Intravenous On call to O.R. 12/12/23 0808 12/12/23 1117   12/12/23 0812  ceFAZolin  (ANCEF ) 2-4 GM/100ML-% IVPB       Note to Pharmacy: Coni Sensor M: cabinet override      12/12/23 0812 12/12/23 1053      No results found for: SDES, SPECREQUEST, CULT, REPTSTATUS  Procedures: Procedure(s) (LRB): INSERTION, INTRAMEDULLARY ROD, FEMUR (Right) REMOVAL, HARDWARE (Right)   Mennie LAMY, MD [facility] 12/14/2023, 10:07 AM

## 2023-12-14 NOTE — Progress Notes (Cosign Needed Addendum)
 Orthopaedic Trauma Service Progress Note  Patient ID: [name] MRN: [id number] DOB/AGE: [birth year] 78 y.o.  Subjective:  Doing ok  R hip is sore but doing better  Sitting up in chair, eating breakfast  Agreeable to snf   Labs stable   ROS As above  Today's  total administered Morphine  Milligram Equivalents: 7.5 Yesterday's total administered Morphine  Milligram Equivalents: 18  Objective:   VITALS:   Vitals:   12/13/23 1516 12/13/23 1928 12/14/23 0403 12/14/23 0730  BP: (!) 119/43 (!) 105/47 121/61 108/71  Pulse: 73 81 79 75  Resp: 18 13 18 18   Temp: 98 F (36.7 C) 97.9 F (36.6 C) 98.3 F (36.8 C) 98.4 F (36.9 C)  TempSrc:  Oral  Oral  SpO2: 96% 97% 96% 98%  Weight:      Height:        Estimated body mass index is 30.82 kg/m as calculated from the following:   Height as of this encounter: 5' 5 (1.651 m).   Weight as of this encounter: 84 kg.   Intake/Output      09/05 0701 09/06 0700 09/06 0701 09/07 0700   P.O. 240    I.V. (mL/kg)     IV Piggyback     Total Intake(mL/kg) 240 (2.9)    Urine (mL/kg/hr)     Blood     Total Output     Net +240         Urine Occurrence 5 x 1 x   Stool Occurrence 1 x      LABS  Results for orders placed or performed during the hospital encounter of 12/10/23 (from the past 24 hours)  CBC     Status: Abnormal   Collection Time: 12/14/23  5:48 AM  Result Value Ref Range   WBC 9.5 4.0 - 10.5 K/uL   RBC 3.36 (L) 3.87 - 5.11 MIL/uL   Hemoglobin 10.4 (L) 12.0 - 15.0 g/dL   HCT 67.7 (L) 63.9 - 53.9 %   MCV 95.8 80.0 - 100.0 fL   MCH 31.0 26.0 - 34.0 pg   MCHC 32.3 30.0 - 36.0 g/dL   RDW 86.1 88.4 - 84.4 %   Platelets 229 150 - 400 K/uL   nRBC 0.0 0.0 - 0.2 %  Basic metabolic panel with GFR     Status: Abnormal   Collection Time: 12/14/23  5:48 AM  Result Value Ref Range   Sodium 140 135 - 145 mmol/L   Potassium 4.3 3.5 - 5.1 mmol/L    Chloride 106 98 - 111 mmol/L   CO2 23 22 - 32 mmol/L   Glucose, Bld 104 (H) 70 - 99 mg/dL   BUN 14 8 - 23 mg/dL   Creatinine, Ser 9.13 0.44 - 1.00 mg/dL   Calcium  9.3 8.9 - 10.3 mg/dL   GFR, Estimated >39 >39 mL/min   Anion gap 11 5 - 15     PHYSICAL EXAM:   Gen: sitting up in chair, looks good, pleasant Lungs: unlabored Cardiac: reg Ext:       Right Lower Extremity Dressings are clean, dry and intact  Extremity is warm  No DCT  Compartments are soft  No pain out of proportion with passive stretching of his toes or ankle  DPN, SPN, TN sensory functions are intact  EHL,  FHL, lesser toe motor functions intact  Ankle flexion, extension, inversion eversion intact  + DP pulse  Assessment/Plan: 2 Days Post-Op   Principal Problem:   Closed right hip fracture (HCC) Active Problems:   Essential hypertension   Hyperlipidemia   GERD (gastroesophageal reflux disease)   Anxiety and depression   Chronic pain syndrome   Closed right femoral fracture (HCC)   Anti-infectives (From admission, onward)    Start     Dose/Rate Route Frequency Ordered Stop   12/12/23 1800  ceFAZolin  (ANCEF ) IVPB 2g/100 mL premix        2 g 200 mL/hr over 30 Minutes Intravenous Every 8 hours 12/12/23 1352 12/13/23 1010   12/12/23 0900  ceFAZolin  (ANCEF ) IVPB 2g/100 mL premix        2 g 200 mL/hr over 30 Minutes Intravenous On call to O.R. 12/12/23 0808 12/12/23 1117   12/12/23 0812  ceFAZolin  (ANCEF ) 2-4 GM/100ML-% IVPB       Note to Pharmacy: Coni Sensor M: cabinet override      12/12/23 0812 12/12/23 1053     .  POD/HD#: 64  78 year old female status post repair of right proximal femur nonunion  PLAN: Weightbearing: WBAT RLE ROM: Unrestricted ROM R hip and knee  Incisional and dressing care: Reinforce dressings as needed, ok to remove dressings as needed and to leave incision open to the air once there is no drainage Showering: Okay to be getting incisions wet starting 12/15/2023.   Clean with soap and water only Orthopedic device(s): None  Pain management: Continue current regimen VTE prophylaxis: Lovenox , SCDs ID:  Ancef  2gm post op completed Foley/Lines:  No foley, KVO IVFs Impediments to Fracture Healing: Vitamin D  level low normal at 30, will start supplementation Dispo: Continue with PT and OT.  Patient agreeable to SNF.  Stable from orthopedic standpoint for discharge to SNF   D/C recommendations: - Oxycodone , Robaxin , Tylenol  for pain control---> prescriptions for oxycodone , Tylenol  and Robaxin  placed in the chart - Eliquis  2.5 mg twice daily x 30 days for DVT prophylaxis---> prescription for Eliquis  placed in the chart - Continue 1000 units Vit D supplementation daily x 90 days   Follow - up plan: 2 weeks after d/c for wound check and repeat x-rays     Francis MICAEL Mt, PA-C [phone] (C) 12/14/2023, 9:50 AM  [facility] [address] [place] [address] [phone] [phone] (F)    After 5pm and on the weekends please log on to Amion, go to orthopaedics and the look under the [facility] Call for the provider(s) on call. You can also call our office at [phone] and then follow the prompts to be connected to the call team.  Patient ID: [name] Ramp, female   DOB: [birth year], 78 y.o.   MRN: [id number]

## 2023-12-14 NOTE — TOC Progression Note (Addendum)
 Transition of Care [facility]) - Progression Note    Patient Details  Name: [name] MRN: [id number] Date of Birth: 1945-05-27  Transition of [facility]) CM/SW Contact  Isaiah Public, LCSWA Phone Number: 12/14/2023, 1:12 PM  Clinical Narrative:     [name] did make SNF bed offer in hub. CSW unable to confirm with facility admissions not in today. CSW Lvm for daughter Tillman. CSW awaiting call back to provide update.  Update- CSW received call back from patients daughter Tillman and provided SNF bed offers. Tillman confirmed number one choice is [facility]. CSW informed her that unable to reach admissions today. CSW to follow up tomorrow. All questions answered no further questions reported at this time.   Expected Discharge Plan: Skilled Nursing Facility Barriers to Discharge: Continued Medical Work up, SNF Pending bed offer               Expected Discharge Plan and Services In-house Referral: Clinical Social Work   Post Acute Care Choice: Skilled Nursing Facility Living arrangements for the past 2 months: Single Family Home                                       Social Drivers of Health (SDOH) Interventions SDOH Screenings   Food Insecurity: No Food Insecurity (12/11/2023)  Housing: Low Risk  (12/11/2023)  Transportation Needs: No Transportation Needs (12/11/2023)  Utilities: Not At Risk (12/11/2023)  Financial Resource Strain: Low Risk  (06/12/2023)   Received from [facility]  Social Connections: Unknown (12/11/2023)  Tobacco Use: Medium Risk (12/12/2023)    Readmission Risk Interventions     No data to display

## 2023-12-14 NOTE — Plan of Care (Signed)

## 2023-12-15 DIAGNOSIS — S72001A Fracture of unspecified part of neck of right femur, initial encounter for closed fracture: Secondary | ICD-10-CM | POA: Diagnosis not present

## 2023-12-15 NOTE — Progress Notes (Signed)
 Mobility Specialist: Progress Note   12/15/23 1500  Mobility  Activity Ambulated with assistance  Level of Assistance Contact guard assist, steadying assist  Assistive Device Front wheel walker  Distance Ambulated (ft) 150 ft  RLE Weight Bearing Per Provider Order WBAT  Activity Response Tolerated well  Mobility Referral Yes  Mobility visit 1 Mobility  Mobility Specialist Start Time (ACUTE ONLY) 1233  Mobility Specialist Stop Time (ACUTE ONLY) 1243  Mobility Specialist Time Calculation (min) (ACUTE ONLY) 10 min    Pt received in chair, very pleasant and agreeable to mobility session. Daughter present. CGA throughout. C/o RLE pain and UE muscle fatigue from using RW. Returned to room without fault. Left on EOB with all needs met, call bell in reach. Family and RN present.   Ileana Lute Mobility Specialist Please contact via SecureChat or Rehab office at [phone]

## 2023-12-15 NOTE — Progress Notes (Signed)
 PROGRESS NOTE [name]  FMW:[id number] DOB: Jul 19, 1945 DOA: 12/10/2023 PCP: Pcp, No  Brief Narrative/Hospital Course: [name] is a 78 y.o. year old female with PMH of  of right-sided hip fracture in March 2025 fixation, essential hypertension, mitral valve regurgitation, chronic pain syndrome, generalized anxiety disorder and depression presented ED after a fall from from the rollator. Seen in the ED with previous history of right hip fracture with fixation in March workup revealed periprosthetic fracture orthopedic consulted and transferred to [facility] for operative intervention. S/p S/p IM rod femur right ORIF 9/4 PT OT following recommending skilled nursing facility.  Subjective: Seen and examined Resting comfortably.  Bit forgetful this morning daughter reports she has early dementia-pleasant interactive communicative answers questions appropriately Overnight afebrile BP stable Assessment and plan:   Close right-sided hip fracture Periprosthetic hip fracture status post fall: S/p IM rod femur right ORIF 9/4. Continue multimodal pain management, DVT prophylaxis and PT OT as per orthopedics service Now on Lovenox  from 9/5> on dc plan for eliquis  for vte prophylaxis.[name] PTOT recommending skilled nursing facility due to her living situation,?  Early dementia as per daughter    Normocytic anemia:  Mild ABLA hemoglobin at 9.7 most likely baseline around 10 g, transfuse less than 7 g.  Once stable Recent Labs  Lab 12/10/23 2219 12/11/23 0326 12/12/23 0442 12/13/23 0509 12/14/23 0548  HGB 12.3 10.7* 10.9* 9.7* 10.4*  HCT 38.7 34.7* 34.5* 29.9* 32.2*     Essential hypertension Hyperlipidemia GERD: Not on meds.BP soft.  Supportive care.   Anxiety and and depression Chronic pain syndrome Fibromyalgia: Mood stable, continue Lyrica  Cymbalta . She is forgetful about the events of fall post op in PACU but now able to recollect, daughter indicates she possibly has early dementia  but she is mostly alert awake oriented interactive communicative and appropriate   Class I Obesity w/ Body mass index is 30.82 kg/m.: Will benefit with PCP follow-up, weight loss,healthy lifestyle and outpatient sleep eval if not done.  Mobility: PT Orders: Active  PT Follow up Rec: Skilled Nursing-Short Term Rehab (<3 Hours/Day)12/14/2023 1300    DVT prophylaxis: enoxaparin  (LOVENOX ) injection 40 mg Start: 12/13/23 0800 SCDs Start: 12/12/23 1353 SCDs Start: 12/11/23 0253 Place TED hose Start: 12/11/23 0253 Code Status:   Code Status: Full Code Family Communication: plan of care discussed with patient and her sister  at bedside.  I had called and updated her daughter 9/5 and she is requesting SNF placement for rehab given her history Patient status is: Remains hospitalized because of severity of illness Level of care: Med-Surg   Dispo: The patient is from: home w/ family            Anticipated disposition: SNF once available . She is medically stable.  Objective: Vitals last 24 hrs: Vitals:   12/14/23 1416 12/14/23 2000 12/15/23 0600 12/15/23 0725  BP: (!) 102/51 (!) 112/50 (!) 140/61 (!) 101/38  Pulse: 73 77 80 80  Resp: 17 13  18   Temp: 98.4 F (36.9 C) 99 F (37.2 C) 98.9 F (37.2 C) 97.6 F (36.4 C)  TempSrc: Oral Oral Oral Oral  SpO2: 100% 100% 97% 93%  Weight:      Height:        Physical Examination: General exam: AAO HEENT:Oral mucosa moist, Ear/Nose WNL grossly Respiratory system: Bilaterally clear BS,no use of accessory muscle Cardiovascular system: S1 & S2 +, No JVD. Gastrointestinal system: Abdomen soft,NT,ND, BS+ Nervous System: Alert, awake, moving all extremities,and following commands.  Extremities: LE edema neg, distal extremities warm.  Right hip surgical site clean with dressing intact Skin: No rashes,no icterus. MSK: Normal muscle bulk,tone, power   Medications reviewed:  Scheduled Meds:  acetaminophen   650 mg Oral Q6H   cholecalciferol   1,000  Units Oral Daily   docusate sodium   100 mg Oral BID   DULoxetine   60 mg Oral QPM   enoxaparin  (LOVENOX ) injection  40 mg Subcutaneous Q24H   mupirocin  ointment  1 Application Nasal BID   pregabalin   50 mg Oral QPM   sodium chloride  flush  3 mL Intravenous Q12H   sodium chloride  flush  3 mL Intravenous Q12H   Continuous Infusions:   Diet: Diet Order             Diet regular Room service appropriate? Yes; Fluid consistency: Thin  Diet effective now                    Data Reviewed: I have personally reviewed following labs and imaging studies ( see epic result tab) CBC: Recent Labs  Lab 12/10/23 2219 12/11/23 0326 12/12/23 0442 12/13/23 0509 12/14/23 0548  WBC 7.6 6.2 6.7 10.5 9.5  HGB 12.3 10.7* 10.9* 9.7* 10.4*  HCT 38.7 34.7* 34.5* 29.9* 32.2*  MCV 94.9 96.4 97.2 95.5 95.8  PLT 264 223 220 192 229   CMP: Recent Labs  Lab 12/10/23 2320 12/11/23 0326 12/12/23 0442 12/13/23 0509 12/14/23 0548  NA 140 139 139 135 140  K 3.8 3.9 4.6 4.4 4.3  CL 106 108 106 106 106  CO2 22 22 24 22 23   GLUCOSE 95 118* 113* 156* 104*  BUN 15 14 16 16 14   CREATININE 0.66 0.76 1.13* 0.99 0.86  CALCIUM  9.4 8.7* 9.3 9.2 9.3   GFR: Estimated Creatinine Clearance: 55.8 mL/min (by C-G formula based on SCr of 0.86 mg/dL). No results for input(s): AST, ALT, ALKPHOS, BILITOT, PROT, ALBUMIN in the last 168 hours. No results for input(s): LIPASE, AMYLASE in the last 168 hours. No results for input(s): AMMONIA in the last 168 hours. Coagulation Profile:  Recent Labs  Lab 12/11/23 0326  INR 1.1   Unresulted Labs (From admission, onward)     Start     Ordered   12/19/23 0500  Creatinine, serum  (enoxaparin  (LOVENOX )    CrCl >/= 30 ml/min)  Weekly,   R     Comments: while on enoxaparin  therapy   Question:  Specimen collection method  Answer:  Lab=Lab collect   12/12/23 1352           Antimicrobials/Microbiology: Anti-infectives (From admission, onward)     Start     Dose/Rate Route Frequency Ordered Stop   12/12/23 1800  ceFAZolin  (ANCEF ) IVPB 2g/100 mL premix        2 g 200 mL/hr over 30 Minutes Intravenous Every 8 hours 12/12/23 1352 12/14/23 0700   12/12/23 0900  ceFAZolin  (ANCEF ) IVPB 2g/100 mL premix        2 g 200 mL/hr over 30 Minutes Intravenous On call to O.R. 12/12/23 0808 12/12/23 1117   12/12/23 0812  ceFAZolin  (ANCEF ) 2-4 GM/100ML-% IVPB       Note to Pharmacy: Coni Sensor M: cabinet override      12/12/23 0812 12/12/23 1053      No results found for: SDES, SPECREQUEST, CULT, REPTSTATUS  Procedures: Procedure(s) (LRB): INSERTION, INTRAMEDULLARY ROD, FEMUR (Right) REMOVAL, HARDWARE (Right)   Mennie LAMY, MD [facility] 12/15/2023, 9:09 AM

## 2023-12-16 DIAGNOSIS — S72001A Fracture of unspecified part of neck of right femur, initial encounter for closed fracture: Secondary | ICD-10-CM | POA: Diagnosis not present

## 2023-12-16 MED ORDER — MELATONIN 3 MG PO TABS
3.0000 mg | ORAL_TABLET | Freq: Every day | ORAL | Status: DC
  Administered 2023-12-16: 3 mg via ORAL
  Filled 2023-12-16: qty 1

## 2023-12-16 MED ORDER — ATORVASTATIN CALCIUM 80 MG PO TABS
80.0000 mg | ORAL_TABLET | Freq: Every day | ORAL | Status: DC
  Administered 2023-12-17: 80 mg via ORAL
  Filled 2023-12-16: qty 1

## 2023-12-16 NOTE — TOC Progression Note (Addendum)
 Transition of Care [facility]) - Progression Note    Patient Details  Name: [name] MRN: [id number] Date of Birth: [birth year]  [facility]) CM/SW Contact  Bridget Cordella Simmonds, LCSW Phone Number: 12/16/2023, 9:54 AM  Clinical Narrative:   Message from [facility]: cannot receive pt until bed opens tomorrow.   CSW updated pt and daughter Tillman.  Transportation discussed, Tillman can transport pt to SNF tomorrow.   1025: SNF auth request submitted in [place].    Expected Discharge Plan: Skilled Nursing Facility Barriers to Discharge: Continued Medical Work up, SNF Pending bed offer               Expected Discharge Plan and Services In-house Referral: Clinical Social Work   Post Acute Care Choice: Skilled Nursing Facility Living arrangements for the past 2 months: Single Family Home                                       Social Drivers of Health (SDOH) Interventions SDOH Screenings   Food Insecurity: No Food Insecurity (12/11/2023)  Housing: Low Risk  (12/11/2023)  Transportation Needs: No Transportation Needs (12/11/2023)  Utilities: Not At Risk (12/11/2023)  Financial Resource Strain: Low Risk  (06/12/2023)   Received from [facility]  Social Connections: Unknown (12/11/2023)  Tobacco Use: Medium Risk (12/12/2023)    Readmission Risk Interventions     No data to display

## 2023-12-16 NOTE — Progress Notes (Signed)
 Mobility Specialist Progress Note:    12/16/23 1742  Mobility  Activity Ambulated with assistance  Level of Assistance Standby assist, set-up cues, supervision of patient - no hands on  Assistive Device Front wheel walker  Distance Ambulated (ft) 100 ft (x2)  RLE Weight Bearing Per Provider Order WBAT  Activity Response Tolerated well  Mobility Referral Yes  Mobility visit 1 Mobility  Mobility Specialist Start Time (ACUTE ONLY) 1400  Mobility Specialist Stop Time (ACUTE ONLY) 1416  Mobility Specialist Time Calculation (min) (ACUTE ONLY) 16 min   Received pt in chair having no complaints and agreeable to mobility. Pt was asymptomatic throughout ambulation and returned to room w/o fault. Left in chair w/ call bell in reach and all needs met.   Thersia Minder Mobility Specialist  Please contact vis Secure Chat or  Rehab Office [phone]

## 2023-12-16 NOTE — Care Management Important Message (Signed)
 Important Message  Patient Details  Name: MERIS REEDE MRN: [id number] Date of Birth: [birth year]   Important Message Given:  Yes - Medicare IM     Jon Cruel 12/16/2023, 12:47 PM

## 2023-12-16 NOTE — Progress Notes (Signed)
 Physical Therapy Treatment  Patient Details Name: [name] MRN: [id number] DOB: June 30, 1945 Today's Date: 12/16/2023   History of Present Illness Pt is an 78 y/o female who presents 12/10/2023 with failed fixation with questionable new fracture of her right proximal femur fracture (originally sustained and fixed in March 2025 at [facility]). 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 Kendal on 12/12/2023. PMH essential hypertension, memory deficit (per family), mitral valve regurgitation, chronic pain syndrome, generalized anxiety disorder and depression.    PT Comments  Pt motivated to participate. Able to progress ambulation distance and tolerance for therapeutic exercise this session. Pt anticipates d/c to [facility] for SNF level rehab tomorrow. She states she is looking forward to going there as she had a good experience there last time she went. Family present in room at end of session and supportive. Will continue to follow.     If plan is discharge home, recommend the following: A little help with walking and/or transfers;A little help with bathing/dressing/bathroom;Assistance with cooking/housework;Assist for transportation;Help with stairs or ramp for entrance;Supervision due to cognitive status   Can travel by private vehicle     Yes  Equipment Recommendations  BSC/3in1    Recommendations for Other Services       Precautions / Restrictions Precautions Precautions: Fall Recall of Precautions/Restrictions: Impaired Precaution/Restrictions Comments: decreased carryover of some safety precs Restrictions Weight Bearing Restrictions Per Provider Order: No RLE Weight Bearing Per Provider Order: Weight bearing as tolerated     Mobility  Bed Mobility               General bed mobility comments: Pt was received sitting up in the recliner.    Transfers Overall transfer level: Needs assistance Equipment used: Rolling walker (2  wheels) Transfers: Sit to/from Stand, Bed to chair/wheelchair/BSC Sit to Stand: Contact guard assist           General transfer comment: Close guard for safety as pt powered up to full stand. No assist required. Pt demonstrated proper hand placement on seated surface for safety.    Ambulation/Gait Ambulation/Gait assistance: Contact guard assist Gait Distance (Feet): 175 Feet Assistive device: Rolling walker (2 wheels) Gait Pattern/deviations: Step-through pattern, Trunk flexed, Decreased stride length Gait velocity: Decreased Gait velocity interpretation: <1.31 ft/sec, indicative of household ambulator   General Gait Details: VC's for improved posture, closer walker proximity and forward gaze. Pt pushing RW in front of her with flexed trunk. Able to make small corrective changes with cues but unable to maintain.   Stairs             Wheelchair Mobility     Tilt Bed    Modified Rankin (Stroke Patients Only)       Balance Overall balance assessment: Needs assistance Sitting-balance support: Feet supported, No upper extremity supported Sitting balance-Leahy Scale: Fair     Standing balance support: Bilateral upper extremity supported, During functional activity, Reliant on assistive device for balance Standing balance-Leahy Scale: Poor Standing balance comment: RW reliant                            Communication Communication Communication: No apparent difficulties  Cognition Arousal: Alert Behavior During Therapy: WFL for tasks assessed/performed   PT - Cognitive impairments: History of cognitive impairments, Safety/Judgement, Sequencing, Memory  PT - Cognition Comments: Pt repeating stories throughout session Following commands: Intact      Cueing Cueing Techniques: Verbal cues, Gestural cues, Tactile cues  Exercises General Exercises - Lower Extremity Ankle Circles/Pumps: 10 reps Quad Sets: 10 reps Long Arc  Quad: 10 reps Hip ABduction/ADduction: 10 reps    General Comments        Pertinent Vitals/Pain Pain Assessment Pain Assessment: Faces Faces Pain Scale: Hurts little more Pain Location: R hip with ambulation; less at rest Pain Descriptors / Indicators: Operative site guarding, Sore (Grabbing) Pain Intervention(s): Limited activity within patient's tolerance, Monitored during session, Repositioned    Home Living                          Prior Function            PT Goals (current goals can now be found in the care plan section) Acute Rehab PT Goals Patient Stated Goal: [place] Goal Formulation: With patient/family Time For Goal Achievement: 12/20/23 Potential to Achieve Goals: Good Progress towards PT goals: Progressing toward goals    Frequency    Min 2X/week      PT Plan      Co-evaluation              AM-PAC PT 6 Clicks Mobility   Outcome Measure  Help needed turning from your back to your side while in a flat bed without using bedrails?: A Little Help needed moving from lying on your back to sitting on the side of a flat bed without using bedrails?: A Little Help needed moving to and from a bed to a chair (including a wheelchair)?: A Little Help needed standing up from a chair using your arms (e.g., wheelchair or bedside chair)?: A Little Help needed to walk in hospital room?: A Little Help needed climbing 3-5 steps with a railing? : A Lot (mod safety/sequencing cues) 6 Click Score: 17    End of Session Equipment Utilized During Treatment: Gait belt Activity Tolerance: Patient tolerated treatment well;Patient limited by pain Patient left: in chair;with call bell/phone within reach;with chair alarm set Nurse Communication: Mobility status;Patient requests pain meds PT Visit Diagnosis: Unsteadiness on feet (R26.81);Pain Pain - Right/Left: Right Pain - part of body: Hip     Time: 8547-8492 PT  Time Calculation (min) (ACUTE ONLY): 15 min  Charges:    $Gait Training: 8-22 mins PT General Charges $$ ACUTE PT VISIT: 1 Visit                     Leita Sable, PT, DPT Acute Rehabilitation Services Secure Chat Preferred Office: [phone]    Leita JONETTA Sable 12/16/2023, 3:20 PM

## 2023-12-16 NOTE — Plan of Care (Signed)
  Problem: Health Behavior/Discharge Planning: Goal: Ability to manage health-related needs will improve Outcome: Progressing   Problem: Clinical Measurements: Goal: Ability to maintain clinical measurements within normal limits will improve Outcome: Progressing Goal: Will remain free from infection Outcome: Progressing Goal: Diagnostic test results will improve Outcome: Progressing Goal: Respiratory complications will improve Outcome: Progressing Goal: Cardiovascular complication will be avoided Outcome: Progressing   Problem: Activity: Goal: Risk for activity intolerance will decrease Outcome: Progressing   Problem: Nutrition: Goal: Adequate nutrition will be maintained Outcome: Progressing   Problem: Coping: Goal: Level of anxiety will decrease Outcome: Progressing   Problem: Elimination: Goal: Will not experience complications related to bowel motility Outcome: Progressing Goal: Will not experience complications related to urinary retention Outcome: Progressing   Problem: Pain Managment: Goal: General experience of comfort will improve and/or be controlled Outcome: Progressing   Problem: Safety: Goal: Ability to remain free from injury will improve Outcome: Progressing   Problem: Skin Integrity: Goal: Risk for impaired skin integrity will decrease Outcome: Progressing   Problem: Education: Goal: Verbalization of understanding the information provided (i.e., activity precautions, restrictions, etc) will improve Outcome: Progressing Goal: Individualized Educational Video(s) Outcome: Progressing   Problem: Activity: Goal: Ability to ambulate and perform ADLs will improve Outcome: Progressing   Problem: Clinical Measurements: Goal: Postoperative complications will be avoided or minimized Outcome: Progressing   Problem: Self-Concept: Goal: Ability to maintain and perform role responsibilities to the fullest extent possible will improve Outcome:  Progressing   Problem: Pain Management: Goal: Pain level will decrease Outcome: Progressing

## 2023-12-16 NOTE — Progress Notes (Signed)
 PROGRESS NOTE [name]  FMW:[id number] DOB: 1945/07/24 DOA: 12/10/2023 PCP: Pcp, No  Brief Narrative/Hospital Course: [name] is a 78 y.o. year old female with PMH of  of right-sided hip fracture in March 2025 fixation, essential hypertension, mitral valve regurgitation, chronic pain syndrome, generalized anxiety disorder and depression presented ED after a fall from from the rollator. Seen in the ED with previous history of right hip fracture with fixation in March workup revealed periprosthetic fracture orthopedic consulted and transferred to [facility] for operative intervention. S/p S/p IM rod femur right ORIF 9/4 PT OT following recommending skilled nursing facility.  Subjective: Seen and examined Overnight afebrile BP stable, awaiting for placement Resting well no complaints.  Pain is controlled.  Assessment and plan   Close right-sided hip fracture Periprosthetic hip fracture status post fall: S/p IM rod femur right ORIF 9/4. Continue multimodal pain management, DVT prophylaxis and PT OT as per orthopedics service On Lovenox  from 9/5> on discharge ortho plannig for eliquis  for vte prophylaxis.SABRA PTOT recommending skilled nursing facility due to her living situation,?  Early dementia as per daughter    Normocytic anemia:  Mild ABLA hemoglobin at 9.7 most likely baseline around 10 g, transfuse less than 7 g. Recent Labs  Lab 12/10/23 2219 12/11/23 0326 12/12/23 0442 12/13/23 0509 12/14/23 0548  HGB 12.3 10.7* 10.9* 9.7* 10.4*  HCT 38.7 34.7* 34.5* 29.9* 32.2*     Essential hypertension Hyperlipidemia GERD: Not on meds.BP soft. Cont supportive care.   Anxiety and and depression Chronic pain syndrome Fibromyalgia: Mood stable, continue Lyrica  Cymbalta . She is forgetful about the events of fall post op in PACU but now able to recollect, daughter indicates she possibly has early dementia but she is mostly alert awake oriented interactive communicative and  appropriate.   Class I Obesity w/ Body mass index is 30.82 kg/m.: Will benefit with PCP follow-up, weight loss,healthy lifestyle and outpatient sleep eval if not done.  Mobility: PT Orders: Active PT Follow up Rec: Skilled Nursing-Short Term Rehab (<3 Hours/Day)12/14/2023 1300    DVT prophylaxis: enoxaparin  (LOVENOX ) injection 40 mg Start: 12/13/23 0800 SCDs Start: 12/12/23 1353 SCDs Start: 12/11/23 0253 Place TED hose Start: 12/11/23 0253 Code Status:   Code Status: Full Code Family Communication: plan of care discussed with patient and her sister  at bedside.,  Again patient daughter at the bedside Patient status is: Remains hospitalized because of severity of illness Level of care: Med-Surg   Dispo: The patient is from: home w/ family            Anticipated disposition: SNF once available . She is medically stable.  Objective: Vitals last 24 hrs: Vitals:   12/15/23 1613 12/15/23 2031 12/16/23 0502 12/16/23 0805  BP: 95/71 (!) 117/50 (!) 144/70 (!) 101/42  Pulse: 78 75 81 81  Resp: 18 18  19   Temp: 98.4 F (36.9 C) 97.7 F (36.5 C) 97.8 F (36.6 C) 98.2 F (36.8 C)  TempSrc: Oral Oral Oral Oral  SpO2: 96% 92% 95% 93%  Weight:      Height:        Physical Examination: General exam: AAO, pleasant HEENT:Oral mucosa moist, Ear/Nose WNL grossly Respiratory system: Bilaterally clear BS,no use of accessory muscle Cardiovascular system: S1 & S2 +, No JVD. Gastrointestinal system: Abdomen soft,NT,ND, BS+ Nervous System: Alert, awake, moving all extremities,and following commands. Extremities: LE edema neg, distal extremities warm.  Rt hip dressing intact Skin: No rashes,no icterus. MSK: Normal muscle bulk,tone, power  Medications reviewed:  Scheduled Meds:  acetaminophen   650 mg Oral Q6H   cholecalciferol   1,000 Units Oral Daily   docusate sodium   100 mg Oral BID   DULoxetine   60 mg Oral QPM   enoxaparin  (LOVENOX ) injection  40 mg Subcutaneous Q24H   mupirocin   ointment  1 Application Nasal BID   pregabalin   50 mg Oral QPM   sodium chloride  flush  3 mL Intravenous Q12H   sodium chloride  flush  3 mL Intravenous Q12H   Continuous Infusions:    Data Reviewed: I have personally reviewed following labs and imaging studies ( see epic result tab) CBC: Recent Labs  Lab 12/10/23 2219 12/11/23 0326 12/12/23 0442 12/13/23 0509 12/14/23 0548  WBC 7.6 6.2 6.7 10.5 9.5  HGB 12.3 10.7* 10.9* 9.7* 10.4*  HCT 38.7 34.7* 34.5* 29.9* 32.2*  MCV 94.9 96.4 97.2 95.5 95.8  PLT 264 223 220 192 229   CMP: Recent Labs  Lab 12/10/23 2320 12/11/23 0326 12/12/23 0442 12/13/23 0509 12/14/23 0548  NA 140 139 139 135 140  K 3.8 3.9 4.6 4.4 4.3  CL 106 108 106 106 106  CO2 22 22 24 22 23   GLUCOSE 95 118* 113* 156* 104*  BUN 15 14 16 16 14   CREATININE 0.66 0.76 1.13* 0.99 0.86  CALCIUM  9.4 8.7* 9.3 9.2 9.3   GFR: Estimated Creatinine Clearance: 55.8 mL/min (by C-G formula based on SCr of 0.86 mg/dL). No results for input(s): AST, ALT, ALKPHOS, BILITOT, PROT, ALBUMIN in the last 168 hours. No results for input(s): LIPASE, AMYLASE in the last 168 hours. No results for input(s): AMMONIA in the last 168 hours. Coagulation Profile:  Recent Labs  Lab 12/11/23 0326  INR 1.1   Unresulted Labs (From admission, onward)     Start     Ordered   12/19/23 0500  Creatinine, serum  (enoxaparin  (LOVENOX )    CrCl >/= 30 ml/min)  Weekly,   R     Comments: while on enoxaparin  therapy   Question:  Specimen collection method  Answer:  Lab=Lab collect   12/12/23 1352           Antimicrobials/Microbiology: Anti-infectives (From admission, onward)    Start     Dose/Rate Route Frequency Ordered Stop   12/12/23 1800  ceFAZolin  (ANCEF ) IVPB 2g/100 mL premix        2 g 200 mL/hr over 30 Minutes Intravenous Every 8 hours 12/12/23 1352 12/14/23 0700   12/12/23 0900  ceFAZolin  (ANCEF ) IVPB 2g/100 mL premix        2 g 200 mL/hr over 30 Minutes  Intravenous On call to O.R. 12/12/23 0808 12/12/23 1117   12/12/23 0812  ceFAZolin  (ANCEF ) 2-4 GM/100ML-% IVPB       Note to Pharmacy: Coni Sensor M: cabinet override      12/12/23 0812 12/12/23 1053      No results found for: SDES, SPECREQUEST, CULT, REPTSTATUS  Procedures: Procedure(s) (LRB): INSERTION, INTRAMEDULLARY ROD, FEMUR (Right) REMOVAL, HARDWARE (Right)   Mennie LAMY, MD [facility] 12/16/2023, 10:01 AM

## 2023-12-17 ENCOUNTER — Encounter (HOSPITAL_COMMUNITY): Payer: Self-pay | Admitting: Student

## 2023-12-17 DIAGNOSIS — S72001A Fracture of unspecified part of neck of right femur, initial encounter for closed fracture: Secondary | ICD-10-CM | POA: Diagnosis not present

## 2023-12-17 NOTE — Progress Notes (Signed)
 Mobility Specialist Progress Note:    12/17/23 1245  Mobility  Activity Ambulated with assistance  Level of Assistance Contact guard assist, steadying assist  Assistive Device Front wheel walker  Distance Ambulated (ft) 100 ft (x2)  RLE Weight Bearing Per Provider Order WBAT  Activity Response Tolerated well  Mobility Referral Yes  Mobility visit 1 Mobility  Mobility Specialist Start Time (ACUTE ONLY) 1000  Mobility Specialist Stop Time (ACUTE ONLY) 1014  Mobility Specialist Time Calculation (min) (ACUTE ONLY) 14 min   Received pt in bed having no complaints and agreeable to mobility. Pt was asymptomatic throughout ambulation and returned to room w/o fault. Left in chair w/ call bell in reach and all needs met. Chair alarm on.   Thersia Minder Mobility Specialist  Please contact vis Secure Chat or  Rehab Office [phone]

## 2023-12-17 NOTE — TOC Transition Note (Signed)
 Transition of Care [facility]) - Discharge Note   Patient Details  Name: [name] MRN: [id number] Date of Birth: [birth year]  Transition of Care [facility]) CM/SW Contact:  Bridget Cordella Simmonds, LCSW Phone Number: 12/17/2023, 11:27 AM   Clinical Narrative:   Pt discharging to [facility],  RN report to [phone].  Daughter Tillman will transport and pt will need to be brought down to main north tower entrance with assistance getting into the vehicle.      Final next level of care: Skilled Nursing Facility Barriers to Discharge: Barriers Resolved   Patient Goals and CMS Choice Patient states their goals for this hospitalization and ongoing recovery are:: be like I was   Choice offered to / list presented to : Patient, Adult Children (pt and daughter Tillman requesting [facility])      Discharge Placement              Patient chooses bed at: [facility] Patient to be transferred to facility by: daughter Tillman Name of family member notified: daughter Tillman Patient and family notified of of transfer: 12/17/23  Discharge Plan and Services Additional resources added to the After Visit Summary for   In-house Referral: Clinical Social Work   Post Acute Care Choice: Skilled Nursing Facility                               Social Drivers of Health (SDOH) Interventions SDOH Screenings   Food Insecurity: No Food Insecurity (12/11/2023)  Housing: Low Risk  (12/11/2023)  Transportation Needs: No Transportation Needs (12/11/2023)  Utilities: Not At Risk (12/11/2023)  Financial Resource Strain: Low Risk  (06/12/2023)   Received [facility]  Social Connections: Unknown (12/11/2023)  Tobacco Use: Medium Risk (12/12/2023)     Readmission Risk Interventions     No data to display

## 2023-12-17 NOTE — TOC Progression Note (Addendum)
 Transition of Care [facility]) - Progression Note    Patient Details  Name: [name] MRN: [id number] Date of Birth: [birth year]  Transition of Care [facility]) CM/SW Contact  Bridget Cordella Simmonds, LCSW Phone Number: 12/17/2023, 8:18 AM  Clinical Narrative:   SNF auth approved in [place]: [id number], 3 days: 9/9-9/11.  CSW confirmed with [facility] that they can receive pt today.   MD informed.    Expected Discharge Plan: Skilled Nursing Facility Barriers to Discharge: Continued Medical Work up, SNF Pending bed offer               Expected Discharge Plan and Services In-house Referral: Clinical Social Work   Post Acute Care Choice: Skilled Nursing Facility Living arrangements for the past 2 months: Single Family Home                                       Social Drivers of Health (SDOH) Interventions SDOH Screenings   Food Insecurity: No Food Insecurity (12/11/2023)  Housing: Low Risk  (12/11/2023)  Transportation Needs: No Transportation Needs (12/11/2023)  Utilities: Not At Risk (12/11/2023)  Financial Resource Strain: Low Risk  (06/12/2023)   Received from [facility]  Social Connections: Unknown (12/11/2023)  Tobacco Use: Medium Risk (12/12/2023)    Readmission Risk Interventions     No data to display

## 2023-12-17 NOTE — Discharge Summary (Signed)
 Physician Discharge Summary  STARLENA BEIL FMW:[id number] DOB: November 01, 1945 DOA: 12/10/2023  PCP: Pcp, No  Admit date: 12/10/2023 Discharge date: 12/17/2023 Recommendations for Outpatient Follow-up:  Follow up with PCP in 1 weeks-call for appointment Please obtain BMP/CBC in one week Follow-up with orthopedics  Discharge Dispo: snf Discharge Condition: Stable Code Status:   Code Status: Full Code Diet recommendation:  Diet Order             Diet - low sodium heart healthy           Diet regular Room service appropriate? Yes; Fluid consistency: Thin  Diet effective now                    Brief/Interim Summary: [name] is a 78 y.o. year old female with PMH of  of right-sided hip fracture in March 2025 fixation, essential hypertension, mitral valve regurgitation, chronic pain syndrome, generalized anxiety disorder and depression presented ED after a fall from from the rollator. Seen in the ED with previous history of right hip fracture with fixation in March workup revealed periprosthetic fracture orthopedic consulted and transferred to [facility] for operative intervention. S/p S/p IM rod femur right ORIF 9/4 PT OT following recommending skilled nursing facility. SNF available on 9/9 and stable for discharge  Subjective: Seen and examined On bedside chair Eager to go to rehab today No new complaints, waiting on placement  Discharge diagnosis:   Close right-sided hip fracture Periprosthetic hip fracture status post fall: S/p IM rod femur right ORIF 9/4. Continue multimodal pain management, DVT prophylaxis and PT OT as per orthopedics service On Lovenox  from 9/5> on discharge ortho plannig for eliquis  for vte prophylaxis At this time she is is stable for discharge    Normocytic anemia:  Mild ABLA hemoglobin stable Recent Labs  Lab 12/10/23 2219 12/11/23 0326 12/12/23 0442 12/13/23 0509 12/14/23 0548  HGB 12.3 10.7* 10.9* 9.7* 10.4*  HCT 38.7 34.7* 34.5* 29.9*  32.2*     Essential hypertension Hyperlipidemia GERD: Not on meds.BP soft. Cont supportive care.   Anxiety and and depression Chronic pain syndrome Fibromyalgia: Mood stable, continue Lyrica  Cymbalta . She is forgetful about the events of fall post op in PACU but now able to recollect, daughter indicates she possibly has early dementia but she is mostly alert awake oriented interactive communicative and appropriate.   Class I Obesity w/ Body mass index is 30.82 kg/m.: Will benefit with PCP follow-up, weight loss,healthy lifestyle and outpatient sleep eval if not done.  Mobility: PT Orders: Active PT Follow up Rec: Skilled Nursing-Short Term Rehab (<3 Hours/Day)12/16/2023 1513    DVT prophylaxis: enoxaparin  (LOVENOX ) injection 40 mg Start: 12/13/23 0800 SCDs Start: 12/12/23 1353 SCDs Start: 12/11/23 0253 Place TED hose Start: 12/11/23 0253 Code Status:   Code Status: Full Code Family Communication: plan of care discussed with patient  Patient status is: Remains hospitalized because of severity of illness Level of care: Med-Surg   Dispo: The patient is from: home w/ family            Anticipated disposition: SNF   Objective: Vitals last 24 hrs: Vitals:   12/16/23 1559 12/16/23 1941 12/17/23 0405 12/17/23 0712  BP: 126/86 136/63 (!) 132/51 (!) 151/56  Pulse: 90 82 84 72  Resp: 18 18 18    Temp: 97.7 F (36.5 C) 98.2 F (36.8 C) 98 F (36.7 C) 98.2 F (36.8 C)  TempSrc: Oral Oral Oral Oral  SpO2: 98% 96% 94% 98%  Weight:  Height:        Physical Examination: General exam: Alert awake pleasant conversant  HEENT:Oral mucosa moist, Ear/Nose WNL grossly Respiratory system: Bilaterally clear BS,no use of accessory muscle Cardiovascular system: S1 & S2 +, No JVD. Gastrointestinal system: Abdomen soft,NT,ND, BS+ Nervous System: Alert, awake, moving all extremities,and following commands. Extremities:distal extremities warm.  Dressing clean dry intact on right hip Skin:  No rashes,no icterus. MSK: Normal muscle bulk,tone, power    Procedure(s) (LRB): INSERTION, INTRAMEDULLARY ROD, FEMUR (Right) REMOVAL, HARDWARE (Right)  Consultation: See note.  Discharge Instructions  Discharge Instructions     Diet - low sodium heart healthy   Complete by: As directed    Discharge instructions   Complete by: As directed    Please call call MD or return to ER for similar or worsening recurring problem that brought you to hospital or if any fever,nausea/vomiting,abdominal pain, uncontrolled pain, chest pain,  shortness of breath or any other alarming symptoms.  Please follow-up your doctor as instructed in a week time and call the office for appointment.  Please avoid alcohol, smoking, or any other illicit substance and maintain healthy habits including taking your regular medications as prescribed.  You were cared for by a hospitalist during your hospital stay. If you have any questions about your discharge medications or the care you received while you were in the hospital after you are discharged, you can call the unit and ask to speak with the hospitalist on call if the hospitalist that took care of you is not available.  Once you are discharged, your primary care physician will handle any further medical issues. Please note that NO REFILLS for any discharge medications will be authorized once you are discharged, as it is imperative that you return to your primary care physician (or establish a relationship with a primary care physician if you do not have one) for your aftercare needs so that they can reassess your need for medications and monitor your lab values   Discharge wound care:   Complete by: As directed    Reinforce dressing until discontinued   Increase activity slowly   Complete by: As directed       Allergies as of 12/17/2023       Reactions   Penicillins    Rash   Wellbutrin [bupropion]         Medication List     STOP taking these  medications    HYDROcodone -acetaminophen  5-325 MG tablet Commonly known as: Norco       TAKE these medications    Accu-Chek Softclix Lancets lancets by Other route. Check Blood Sugar Once Daily   acetaminophen  325 MG tablet Commonly known as: TYLENOL  Take 2 tablets (650 mg total) by mouth every 6 (six) hours. What changed:  medication strength how much to take when to take this reasons to take this   apixaban  2.5 MG Tabs tablet Commonly known as: Eliquis  Take 1 tablet (2.5 mg total) by mouth 2 (two) times daily.   atorvastatin  80 MG tablet Commonly known as: LIPITOR  Take one tablet nightly for cholesterol   DULoxetine  60 MG capsule Commonly known as: CYMBALTA  Take 60 mg by mouth every evening.   esomeprazole  40 MG capsule Commonly known as: NexIUM  Take 1 capsule (40 mg total) by mouth daily.   ibuprofen 200 MG tablet Commonly known as: ADVIL Take 200 mg by mouth every 6 (six) hours as needed.   methocarbamol  500 MG tablet Commonly known as: ROBAXIN  Take 1 tablet (500  mg total) by mouth every 6 (six) hours as needed for muscle spasms.   oxyCODONE  5 MG immediate release tablet Commonly known as: Oxy IR/ROXICODONE  Take 0.5-1 tablets (2.5-5 mg total) by mouth every 4 (four) hours as needed for moderate pain (pain score 4-6) or severe pain (pain score 7-10) (2.5 mg for moderate pain, 5 mg for severe pain).   pregabalin  50 MG capsule Commonly known as: LYRICA  Take 50 mg by mouth every evening.               Discharge Care Instructions  (From admission, onward)           Start     Ordered   12/17/23 0000  Discharge wound care:       Comments: Reinforce dressing until discontinued   12/17/23 1024            Contact information for follow-up providers     Haddix, Franky SQUIBB, MD. Schedule an appointment as soon as possible for a visit in 2 week(s).   Specialty: Orthopedic Surgery Why: For wound check and repeat x-rays Contact information: [address] [place] [address] [phone]              Contact information for after-discharge care     Destination     [facility] .   Service: Skilled Nursing Contact information: [address] [place]  [phone]                    Allergies  Allergen Reactions   Penicillins     Rash   Wellbutrin [Bupropion]     The results of significant diagnostics from this hospitalization (including imaging, microbiology, ancillary and laboratory) are listed below for reference.    Microbiology: Recent Results (from the past 240 hours)  Surgical PCR screen     Status: None   Collection Time: 12/11/23  3:59 AM   Specimen: Nasal Mucosa; Nasal Swab  Result Value Ref Range Status   MRSA, PCR NEGATIVE NEGATIVE Final   Staphylococcus aureus NEGATIVE NEGATIVE Final    Comment: (NOTE) The Xpert SA Assay (FDA approved for NASAL specimens in patients 23 years of age and older), is one component of a comprehensive surveillance program. It is not intended to diagnose infection nor to guide or monitor treatment. Performed at [facility], [address], [place], [place] [address]     Procedures/Studies: DG FEMUR PORT, MIN 2 VIEWS RIGHT Result Date: 12/12/2023 CLINICAL DATA:  Fracture, postop. EXAM: RIGHT FEMUR PORTABLE 2 VIEW COMPARISON:  Preoperative imaging FINDINGS: Femoral intramedullary nail with trans trochanteric and distal locking screw fixation traverse proximal femur fracture. Recent postsurgical change includes air and edema in the soft tissues. IMPRESSION: ORIF of proximal femur fracture without immediate postoperative complication. Electronically Signed   By: Andrea Gasman M.D.   On: 12/12/2023 14:55   DG FEMUR, MIN 2 VIEWS RIGHT Result Date: 12/12/2023 CLINICAL DATA:  Femoral nail exchange. EXAM: RIGHT FEMUR 2 VIEWS COMPARISON:  None Available. FINDINGS:  Multiple intraoperative spot images demonstrate removal of previously seen right femoral hardware and replacement with new hardware across the proximal shaft fracture. Near anatomic alignment. No hardware complicating feature. IMPRESSION: Hardware revision across the proximal right femoral shaft fracture. No complicating feature. Electronically Signed   By: Franky Crease M.D.   On: 12/12/2023 12:25   DG C-Arm 1-60 Min-No Report Result Date: 12/12/2023 Fluoroscopy was utilized  by the requesting physician.  No radiographic interpretation.   DG C-Arm 1-60 Min-No Report Result Date: 12/12/2023 Fluoroscopy was utilized by the requesting physician.  No radiographic interpretation.   CT PELVIS WO CONTRAST Result Date: 12/11/2023 CLINICAL DATA:  Pelvic fracture EXAM: CT PELVIS WITHOUT CONTRAST TECHNIQUE: Multidetector CT imaging of the pelvis was performed following the standard protocol without intravenous contrast. RADIATION DOSE REDUCTION: This exam was performed according to the departmental dose-optimization program which includes automated exposure control, adjustment of the mA and/or kV according to patient size and/or use of iterative reconstruction technique. COMPARISON:  Right hip x-ray same day FINDINGS: Urinary Tract:  No abnormality visualized. Bowel:  Unremarkable visualized pelvic bowel loops. Vascular/Lymphatic: No pathologically enlarged lymph nodes. No significant vascular abnormality seen. Reproductive:  No mass or other significant abnormality Other:  None. Musculoskeletal: The bones are diffusely osteopenic. There is a right hip intramedullary nail and hip screw. There is a comminuted intratrochanteric fracture which appears chronic and healing. Additionally there is an acute appearing oblique fracture through the proximal diaphysis surrounding the intramedullary nail with fracture fragments distracted up to 8 mm. There is no dislocation. There is scarring lateral to the right hip. IMPRESSION: 1.  Acute appearing oblique fracture through the proximal diaphysis of the right femur surrounding the intramedullary nail. 2. Chronic and healing right intertrochanteric fracture. Electronically Signed   By: Greig Pique M.D.   On: 12/11/2023 01:22   DG Hip Unilat  With Pelvis 2-3 Views Right Result Date: 12/10/2023 CLINICAL DATA:  Fall injury. EXAM: DG HIP (WITH OR WITHOUT PELVIS) 2-3V RIGHT COMPARISON:  None. FINDINGS: Three views with AP pelvis and AP and cross-table lateral views of the right hip. There is an oblique fracture of the subtrochanteric proximal right femoral shaft with no more than 1 cortex width of medial displacement of the distal fracture fragment. This does appear recent but not sure if this is acute because there is fracture fixation hardware in place. The hardware consists of a long intramedullary femoral nail extending down to the distal diametaphysis, with 2 locking securing screws distally and a fluted locking screw proximally extending through the proximal rod into the neck and head of the femur. There is evidence of at least 1 removed screw in the proximal femoral shaft below the level of the current fracture. The bone mineralization is normal. There is a small comminution fragment along the medial fracture margin, another superimposing inferior to the femoral neck although does not appear to originate from the neck of the right femur. There is no evidence of pelvic fracture or diastasis. Narrowing and spurring is seen in the symphysis but no significant hip joint arthrosis. The SI joints are unremarkable, as visualized. IMPRESSION: 1. Oblique fracture of the subtrochanteric proximal right femoral shaft with no more than 1 cortex width of medial displacement of the distal fracture fragment. This does appear recent but not sure if this is acute because there is fracture fixation hardware in place, and there are no prior similar studies for comparison. 2. No evidence of pelvic fracture or  diastasis. Electronically Signed   By: Francis Quam M.D.   On: 12/10/2023 21:57    Labs: BNP (last 3 results) No results for input(s): BNP in the last 8760 hours. Basic Metabolic Panel: Recent Labs  Lab 12/10/23 2320 12/11/23 0326 12/12/23 0442 12/13/23 0509 12/14/23 0548  NA 140 139 139 135 140  K 3.8 3.9 4.6 4.4 4.3  CL 106 108 106 106 106  CO2 22  22 24 22 23   GLUCOSE 95 118* 113* 156* 104*  BUN 15 14 16 16 14   CREATININE 0.66 0.76 1.13* 0.99 0.86  CALCIUM  9.4 8.7* 9.3 9.2 9.3   Liver Function Tests: No results for input(s): AST, ALT, ALKPHOS, BILITOT, PROT, ALBUMIN in the last 168 hours. No results for input(s): LIPASE, AMYLASE in the last 168 hours. No results for input(s): AMMONIA in the last 168 hours. CBC: Recent Labs  Lab 12/10/23 2219 12/11/23 0326 12/12/23 0442 12/13/23 0509 12/14/23 0548  WBC 7.6 6.2 6.7 10.5 9.5  HGB 12.3 10.7* 10.9* 9.7* 10.4*  HCT 38.7 34.7* 34.5* 29.9* 32.2*  MCV 94.9 96.4 97.2 95.5 95.8  PLT 264 223 220 192 229   CBG: No results for input(s): GLUCAP in the last 168 hours. Hgb A1c No results for input(s): HGBA1C in the last 72 hours. Anemia work up No results for input(s): VITAMINB12, FOLATE, FERRITIN, TIBC, IRON, RETICCTPCT in the last 72 hours. Cardiac Enzymes: No results for input(s): CKTOTAL, CKMB, CKMBINDEX, TROPONINI in the last 168 hours. BNP: Invalid input(s): POCBNP D-Dimer No results for input(s): DDIMER in the last 72 hours. Lipid Profile No results for input(s): CHOL, HDL, LDLCALC, TRIG, CHOLHDL, LDLDIRECT in the last 72 hours. Thyroid function studies No results for input(s): TSH, T4TOTAL, T3FREE, THYROIDAB in the last 72 hours.  Invalid input(s): FREET3 Urinalysis    Component Value Date/Time   COLORURINE YELLOW 10/19/2014 1654   APPEARANCEUR CLEAR 10/19/2014 1654   LABSPEC 1.016 10/19/2014 1654   PHURINE 6.5 10/19/2014 1654   GLUCOSEU  NEG 10/19/2014 1654   HGBUR NEG 10/19/2014 1654   BILIRUBINUR NEG 10/19/2014 1654   KETONESUR NEG 10/19/2014 1654   PROTEINUR NEG 10/19/2014 1654   UROBILINOGEN 0.2 10/19/2014 1654   NITRITE NEG 10/19/2014 1654   LEUKOCYTESUR SMALL (A) 10/19/2014 1654   Sepsis Labs Recent Labs  Lab 12/11/23 0326 12/12/23 0442 12/13/23 0509 12/14/23 0548  WBC 6.2 6.7 10.5 9.5   Microbiology Recent Results (from the past 240 hours)  Surgical PCR screen     Status: None   Collection Time: 12/11/23  3:59 AM   Specimen: Nasal Mucosa; Nasal Swab  Result Value Ref Range Status   MRSA, PCR NEGATIVE NEGATIVE Final   Staphylococcus aureus NEGATIVE NEGATIVE Final    Comment: (NOTE) The Xpert SA Assay (FDA approved for NASAL specimens in patients 24 years of age and older), is one component of a comprehensive surveillance program. It is not intended to diagnose infection nor to guide or monitor treatment. Performed at [facility], [address] [place], [place] [address]    Time coordinating discharge: 35 minutes  SIGNED: Mennie LAMY, MD  [facility] 12/17/2023, 10:24 AM  If 7PM-7AM, please contact night-coverage [email or web address]

## 2024-01-17 ENCOUNTER — Encounter: Payer: Self-pay | Admitting: Physician Assistant

## 2024-02-01 NOTE — Progress Notes (Addendum)
 Assessment/Plan:     [name] is a very pleasant 78 y.o. year old RH female with a history of hypertension, hyperlipidemia, depression, anxiety seen today for evaluation of memory loss. Daughter reports that she has been diagnosed with vascular dementia by her PCP while living in  [place], [place]. She was not seen  by neurologist while there. She is not on antidementia meds. She then relocated to this area. Recently sustained a R closed hip fracture recuperating well, cared by her 2 children. MoCA today is 13/30 . 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  Workup in progress.    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   Continue to control mood as per PCP [place] up in 3 months   Subjective:    The patient is accompanied by her daughter  who supplements  the history.    How long did patient have memory difficulties?  For about 1.5 years, when she was diagnosed in [place] with vascular dementia. Patient reports difficulty  with STM such as  new information, recent conversations, names. LTM is very good she can tell you stories from a long time ago.  She also has difficulty making decisions and has issues on how to use the remote control.  repeats oneself?  Endorsed Disoriented when walking into a room? Denies    Leaving objects in unusual places?  Endorsed, in several places   Wandering behavior? Denies.   Any personality changes, or depression, anxiety? She has anxiety and some depression Hallucinations or paranoia? Denies.   Seizures? Denies.    Any sleep changes?  Sleeps well. Denies frequent nightmares or dream reenactment, other REM behavior or  sleepwalking   Sleep apnea? Maybe, but has not been tested for the last 7 years daughter says  Any hygiene concerns?  Denies.   Independent of bathing and dressing? Endorsed  Does the patient need help with medications?Daughter is in charge   Who is in charge of the finances? Daughter  is in charge     Any changes in appetite?   Denies.     Patient have trouble swallowing?  Denies.   Does the patient cook? No   Any headaches?  Denies.   Chronic pain? She has chronic pain from arthritis   Ambulates with difficulty? she had a recent closed R hip fracture on 12/10/2023 after a mechanical fall at home, no LOC, no head injury. Doing PT Recent falls or head injuries? She broke her hip and had a rod readjustment 5 weeks ago Vision changes?  Denies any new issues  Any strokelike symptoms? Had several TIA for the last 2 years-daughter says (no neuro records seen)  Any tremors? Denies.   Any anosmia? Denies.   Any incontinence of urine? Yes  Any bowel dysfunction?incontinence of stool Patient lives with daughter   History of heavy alcohol intake? Denies.   History of heavy tobacco use? Denies.   Family history of dementia?   Mother with dementia , AD. Brother with vascular dementia Does patient drive? 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     Allergies  Allergen Reactions  Penicillins     Rash   Wellbutrin [Bupropion]     Current Outpatient Medications  Medication Instructions   ACCU-CHEK SOFTCLIX LANCETS lancets Other, Check Blood Sugar Once Daily    acetaminophen  (TYLENOL ) 650 mg, Oral, Every 6 hours   apixaban  (ELIQUIS ) 2.5 mg, Oral, 2 times daily   atorvastatin  (LIPITOR ) 80 MG tablet Take one tablet nightly for cholesterol   DULoxetine  (CYMBALTA ) 60 mg, Oral, Every evening   esomeprazole  (NEXIUM ) 40 mg, Oral, Daily   ibuprofen (ADVIL) 200 mg, Oral, Every 6 hours PRN   methocarbamol  (ROBAXIN ) 500 mg,  Oral, Every 6 hours PRN   oxyCODONE  (OXY IR/ROXICODONE ) 2.5-5 mg, Oral, Every 4 hours PRN   pregabalin  (LYRICA ) 50 mg, Oral, Every evening     VITALS:   Vitals:   02/04/24 1448  BP: (!) 100/55  Pulse: 95  Resp: 20  SpO2: 95%     Physical Exam  :    02/04/2024    5:00 PM  Montreal Cognitive Assessment   Visuospatial/ Executive (0/5) 1  Naming (0/3) 1  Attention: Read list of digits (0/2) 2  Attention: Read list of letters (0/1) 1  Attention: Serial 7 subtraction starting at 100 (0/3) 1  Language: Repeat phrase (0/2) 0  Language : Fluency (0/1) 0  Abstraction (0/2) 1  Delayed Recall (0/5) 0  Orientation (0/6) 6  Total 13  Adjusted Score (based on education) 13        No data to display             HEENT:  Normocephalic, atraumatic.  The superficial temporal arteries are without ropiness or tenderness. Cardiovascular: Regular rate and rhythm. Lungs: Clear to auscultation bilaterally. Neck: There are no carotid bruits noted bilaterally. Orientation:  Alert and oriented to person, place and time. No aphasia or dysarthria. Fund of knowledge is appropriate. Recent and remote memory impaired.  Attention and concentration are reduced .  Able to name objects 1/3 and unable to repeat phrases.  Delayed recall  0/5 .  Cranial nerves: There is good facial symmetry. Anxious appearing. Extraocular muscles are intact and visual fields are full to confrontational testing. Speech is fluent and clear. No tongue deviation. Hearing is intact to conversational tone.  Tone: Tone is good throughout. Sensation: Sensation is intact to light touch.  Vibration is intact at the bilateral big toe.  Coordination: The patient has no difficulty with RAM's or FNF bilaterally. Normal finger to nose  Motor: Strength is 5/5 in the bilateral upper and lower extremities. There is no pronator drift. There are no fasciculations noted. DTR's: Deep tendon reflexes are 2/4 bilaterally. Gait and Station: The  patient is able to ambulate without difficulty. Gait is cautious and narrow. Stride length is short, uses a walker for stability.        Thank you for allowing us  the opportunity to participate in the care of this nice patient. Please do not hesitate to contact us  for any questions or concerns.   Total time spent on today's visit was 50 minutes dedicated to this patient today, preparing to see patient, examining the patient, ordering tests and/or medications and counseling the patient, documenting clinical information in the EHR or other health record, independently interpreting results and communicating results to the patient/family, discussing treatment and goals, answering patient's questions and coordinating care.  Cc:  System, Provider Not In  Camie Sevin 02/04/2024 5:56 PM

## 2024-02-04 ENCOUNTER — Other Ambulatory Visit

## 2024-02-04 ENCOUNTER — Ambulatory Visit (INDEPENDENT_AMBULATORY_CARE_PROVIDER_SITE_OTHER): Admitting: Physician Assistant

## 2024-02-04 ENCOUNTER — Encounter: Payer: Self-pay | Admitting: Physician Assistant

## 2024-02-04 ENCOUNTER — Ambulatory Visit

## 2024-02-04 VITALS — BP 100/55 | HR 95 | Resp 20

## 2024-02-04 DIAGNOSIS — R413 Other amnesia: Secondary | ICD-10-CM | POA: Insufficient documentation

## 2024-02-04 NOTE — Patient Instructions (Addendum)
  MRI brain without contrast to assess for underlying structural abnormality and assess vascular load [phone]   Check B12, TSH labs due in [address] [place] up in 3 months

## 2024-02-05 ENCOUNTER — Ambulatory Visit: Payer: Self-pay | Admitting: Neurology

## 2024-02-05 LAB — TSH: TSH: 2.24 m[IU]/L (ref 0.40–4.50)

## 2024-02-05 LAB — VITAMIN B12: Vitamin B-12: 498 pg/mL (ref 200–1100)

## 2024-02-28 ENCOUNTER — Encounter: Payer: Self-pay | Admitting: Physician Assistant

## 2024-03-10 ENCOUNTER — Ambulatory Visit
Admission: RE | Admit: 2024-03-10 | Discharge: 2024-03-10 | Disposition: A | Source: Ambulatory Visit | Attending: Physician Assistant | Admitting: Physician Assistant

## 2024-03-16 ENCOUNTER — Telehealth: Payer: Self-pay | Admitting: Physician Assistant

## 2024-03-16 NOTE — Telephone Encounter (Signed)
 Pt's daughter calling for Results

## 2024-03-17 NOTE — Telephone Encounter (Signed)
 I contacted daughter POA, Tillman and she thanked me for calling. She would like the memory  medication called in please advise.

## 2024-03-18 ENCOUNTER — Other Ambulatory Visit: Payer: Self-pay | Admitting: Physician Assistant

## 2024-03-18 MED ORDER — DONEPEZIL HCL 10 MG PO TABS: ORAL_TABLET | ORAL | 3 refills | Status: DC

## 2024-03-26 NOTE — Telephone Encounter (Signed)
 Called pt and talked with daughter. Reported Saras results from MRI. She had read it on My Chart and had no more questions.

## 2024-03-31 ENCOUNTER — Encounter: Payer: Self-pay | Admitting: Emergency Medicine

## 2024-03-31 ENCOUNTER — Ambulatory Visit
Admission: EM | Admit: 2024-03-31 | Discharge: 2024-03-31 | Disposition: A | Discharge status: Home / Self Care | Attending: Family Medicine | Admitting: Family Medicine

## 2024-03-31 DIAGNOSIS — J019 Acute sinusitis, unspecified: Secondary | ICD-10-CM

## 2024-03-31 MED ORDER — DOXYCYCLINE HYCLATE 100 MG PO CAPS: 100.0000 mg | ORAL_CAPSULE | Freq: Two times a day (BID) | ORAL | 0 refills | Status: AC

## 2024-03-31 MED ORDER — AZELASTINE-FLUTICASONE 137-50 MCG/ACT NA SUSP: 1.0000 | Freq: Two times a day (BID) | NASAL | 0 refills | Status: AC

## 2024-03-31 NOTE — Discharge Instructions (Signed)
 Use the Dymista  nasal spray twice a day This will help relieve the nasal congestion and postnasal drip.  That will help your throat and voice Take the doxycycline  antibiotic 2 times a day.  Take with food so it does not upset your stomach Make sure you are drinking lots of water See your doctor if not improving by next week

## 2024-03-31 NOTE — ED Triage Notes (Signed)
 Patient c/o cough that's worse at night, it almost takes her breath away x 3 days.  Sinus drainage which is causing her to loose her voice.  Afebrile.  Patient has taken Alka-Seltzer and Ibuprofen.

## 2024-03-31 NOTE — ED Provider Notes (Signed)
 " [name]    CSN: [id number] Arrival date & time: 03/31/24  1631      History   Chief Complaint Chief Complaint  Patient presents with   Cough    HPI [name] is a 78 y.o. female.   HPI Patient is here with her daughter.  She has been sick for 2 weeks with postnasal drip, sinus congestion, hoarse voice, and cough.  She is doing a lot of coughing at night.  No complaint of fever chills body aches.  Daughter recently treated with antibiotics for sinus infection.  She is helping English for her mother with memory impairment. Past Medical History:  Diagnosis Date   Depression    GERD (gastroesophageal reflux disease)    Hyperlipidemia    Obesity    Prediabetes    Vitamin D  deficiency     Patient Active Problem List   Diagnosis Date Noted   Memory impairment 02/04/2024   Closed right hip fracture (HCC) 12/11/2023   Chronic pain syndrome 12/11/2023   Closed right femoral fracture (HCC) 12/11/2023   Medication management 07/09/2014   Anxiety and depression 07/09/2014   Osteoporosis 01/04/2014   GERD (gastroesophageal reflux disease) 01/04/2014   CKD (chronic kidney disease) stage 2, GFR 60-89 ml/min 01/04/2014   Fibromyalgia 01/04/2014   Essential hypertension    Hyperlipidemia    Morbid obesity (BMI 37)     Prediabetes    Vitamin D  deficiency     Past Surgical History:  Procedure Laterality Date   ABDOMINAL HYSTERECTOMY     CARPAL TUNNEL RELEASE Bilateral    FEMUR IM NAIL Right 12/12/2023   Procedure: INSERTION, INTRAMEDULLARY ROD, FEMUR;  Surgeon: Kendal Franky SQUIBB, MD;  Location: [facility] OR;  Service: Orthopedics;  Laterality: Right;  REVISION IM NAIL RIGHT FEMUR   HARDWARE REMOVAL Right 12/12/2023   Procedure: REMOVAL, HARDWARE;  Surgeon: Kendal Franky SQUIBB, MD;  Location: [facility] OR;  Service: Orthopedics;  Laterality: Right;   LASIK     ORIF right hip Right    March 2025   PILONIDAL CYST EXCISION  04/09/1976   SKIN CANCER EXCISION      OB History   No  obstetric history on file.      Home Medications    Prior to Admission medications  Medication Sig Start Date End Date Taking? Authorizing Provider  ACCU-CHEK SOFTCLIX LANCETS lancets by Other route. Check Blood Sugar Once Daily   Yes [provider]  acetaminophen  (TYLENOL ) 325 MG tablet Take 2 tablets (650 mg total) by mouth every 6 (six) hours. 12/14/23  Yes Deward Eck, PA-C  Azelastine -Fluticasone  137-50 MCG/ACT SUSP Place 1 puff into the nose in the morning and at bedtime. 03/31/24  Yes Maranda Jamee Jacob, MD  busPIRone (BUSPAR) 5 MG tablet Take 5 mg by mouth 3 (three) times daily.   Yes [provider]  doxycycline  (VIBRAMYCIN ) 100 MG capsule Take 1 capsule (100 mg total) by mouth 2 (two) times daily. 03/31/24  Yes Maranda Jamee Jacob, MD  FLUoxetine  (PROZAC ) 20 MG capsule Take 20 mg by mouth daily. 08/14/19  Yes [provider]  pregabalin  (LYRICA ) 50 MG capsule Take 50 mg by mouth every evening.   Yes [provider]  apixaban  (ELIQUIS ) 2.5 MG TABS tablet Take 1 tablet (2.5 mg total) by mouth 2 (two) times daily. 12/14/23 01/13/24  Deward Eck, PA-C  atorvastatin  (LIPITOR ) 80 MG tablet Take one tablet nightly for cholesterol Patient not taking: Reported on 12/11/2023 08/02/14   Tonita,  Elsie, MD  esomeprazole  (NEXIUM ) 40 MG capsule Take 1 capsule (40 mg total) by mouth daily. 04/08/14 04/08/15  Craig Alan SAUNDERS, PA-C    Family History Family History  Problem Relation Age of Onset   Diabetes Mother    Heart disease Mother    Hypertension Mother    Heart disease Father     Social History Social History[1]   Allergies   Penicillins and Wellbutrin [bupropion]   Review of Systems Review of Systems See HPI  Physical Exam Triage Vital Signs ED Triage Vitals  Encounter Vitals Group     BP 03/31/24 1650 130/88     Girls Systolic BP Percentile --      Girls Diastolic BP Percentile --      Boys Systolic BP Percentile --      Boys Diastolic  BP Percentile --      Pulse Rate 03/31/24 1650 90     Resp 03/31/24 1650 18     Temp 03/31/24 1650 98.2 F (36.8 C)     Temp Source 03/31/24 1650 Oral     SpO2 03/31/24 1650 97 %     Weight 03/31/24 1646 187 lb (84.8 kg)     Height 03/31/24 1646 5' 4 (1.626 m)     Head Circumference --      Peak Flow --      Pain Score 03/31/24 1646 0     Pain Loc --      Pain Education --      Exclude from Growth Chart --    No data found.  Updated Vital Signs BP 130/88 (BP Location: Left Arm)   Pulse 90   Temp 98.2 F (36.8 C) (Oral)   Resp 18   Ht 5' 4 (1.626 m)   Wt 84.8 kg   SpO2 97%   BMI 32.10 kg/m      Physical Exam Constitutional:      General: She is not in acute distress.    Appearance: She is well-developed.     Comments: Pleasant and amusing  HENT:     Head: Normocephalic and atraumatic.     Right Ear: Tympanic membrane normal.     Left Ear: Tympanic membrane normal.     Nose: Congestion and rhinorrhea present.     Comments: Voice is hoarse    Mouth/Throat:     Pharynx: Posterior oropharyngeal erythema present.  Eyes:     Conjunctiva/sclera: Conjunctivae normal.     Pupils: Pupils are equal, round, and reactive to light.  Cardiovascular:     Rate and Rhythm: Normal rate and regular rhythm.     Heart sounds: Normal heart sounds.  Pulmonary:     Effort: Pulmonary effort is normal. No respiratory distress.     Breath sounds: Rhonchi present.  Musculoskeletal:        General: Normal range of motion.     Cervical back: Normal range of motion.  Skin:    General: Skin is warm and dry.  Neurological:     Mental Status: She is alert.      UC Treatments / Results  Labs (all labs ordered are listed, but only abnormal results are displayed) Labs Reviewed - No data to display  EKG   Radiology No results found.  Procedures Procedures (including critical English time)  Medications Ordered in UC Medications - No data to display  Initial Impression /  Assessment and Plan / UC Course  I have reviewed the triage vital signs and the  nursing notes.  Pertinent labs & imaging results that were available during my English of the patient were reviewed by me and considered in my medical decision making (see chart for details).     Concern for symptoms lasting 10 days.  Elderly patient.  Will treat with antibiotics. Final Clinical Impressions(s) / UC Diagnoses   Final diagnoses:  Acute sinusitis with symptoms greater than 10 days     Discharge Instructions      Use the Dymista  nasal spray twice a day This will help relieve the nasal congestion and postnasal drip.  That will help your throat and voice Take the doxycycline  antibiotic 2 times a day.  Take with food so it does not upset your stomach Make sure you are drinking lots of water See your doctor if not improving by next week   ED Prescriptions     Medication Sig Dispense Auth. Provider   doxycycline  (VIBRAMYCIN ) 100 MG capsule Take 1 capsule (100 mg total) by mouth 2 (two) times daily. 20 capsule Maranda Jamee Jacob, MD   Azelastine -Fluticasone  137-50 MCG/ACT SUSP Place 1 puff into the nose in the morning and at bedtime. 23 g Maranda Jamee Jacob, MD      PDMP not reviewed this encounter.    [1]  Social History Tobacco Use   Smoking status: Former    Current packs/day: 0.00    Types: Cigarettes    Quit date: 07/01/1998    Years since quitting: 25.7   Smokeless tobacco: Never  Vaping Use   Vaping status: Never Used  Substance Use Topics   Alcohol use: No   Drug use: No     Maranda Jamee Jacob, MD 03/31/24 1754  "

## 2024-04-21 NOTE — Progress Notes (Unsigned)
 "  Office Visit Note  Patient: [name]             Date of Birth: 04/14/1945           MRN: [id number]             PCP: System, Provider Not In Referring: Alverda Mardy SAUNDERS, MD Visit Date: 04/30/2024 Occupation: Data Unavailable  Subjective:  No chief complaint on file.   History of Present Illness: [name] is a 79 y.o. female ***     Activities of Daily Living:  Patient reports morning stiffness for *** {minute/hour:19697}.   Patient {ACTIONS;DENIES/REPORTS:21021675::Denies} nocturnal pain.  Difficulty dressing/grooming: {ACTIONS;DENIES/REPORTS:21021675::Denies} Difficulty climbing stairs: {ACTIONS;DENIES/REPORTS:21021675::Denies} Difficulty getting out of chair: {ACTIONS;DENIES/REPORTS:21021675::Denies} Difficulty using hands for taps, buttons, cutlery, and/or writing: {ACTIONS;DENIES/REPORTS:21021675::Denies}  No Rheumatology ROS completed.   PMFS History:  Patient Active Problem List   Diagnosis Date Noted   Memory impairment 02/04/2024   Closed right hip fracture (HCC) 12/11/2023   Chronic pain syndrome 12/11/2023   Closed right femoral fracture (HCC) 12/11/2023   Medication management 07/09/2014   Anxiety and depression 07/09/2014   Osteoporosis 01/04/2014   GERD (gastroesophageal reflux disease) 01/04/2014   CKD (chronic kidney disease) stage 2, GFR 60-89 ml/min 01/04/2014   Fibromyalgia 01/04/2014   Essential hypertension    Hyperlipidemia    Morbid obesity (BMI 37)     Prediabetes    Vitamin D  deficiency     Past Medical History:  Diagnosis Date   Depression    GERD (gastroesophageal reflux disease)    Hyperlipidemia    Obesity    Prediabetes    Vitamin D  deficiency     Family History  Problem Relation Age of Onset   Diabetes Mother    Heart disease Mother    Hypertension Mother    Heart disease Father    Past Surgical History:  Procedure Laterality Date   ABDOMINAL HYSTERECTOMY     CARPAL TUNNEL RELEASE Bilateral     FEMUR IM NAIL Right 12/12/2023   Procedure: INSERTION, INTRAMEDULLARY ROD, FEMUR;  Surgeon: Kendal Franky SQUIBB, MD;  Location: [facility] OR;  Service: Orthopedics;  Laterality: Right;  REVISION IM NAIL RIGHT FEMUR   HARDWARE REMOVAL Right 12/12/2023   Procedure: REMOVAL, HARDWARE;  Surgeon: Kendal Franky SQUIBB, MD;  Location: [facility] OR;  Service: Orthopedics;  Laterality: Right;   LASIK     ORIF right hip Right    March 2025   PILONIDAL CYST EXCISION  04/09/1976   SKIN CANCER EXCISION     Social History[1] Social History   Social History Narrative   Not on file     Immunization History  Administered Date(s) Administered   INFLUENZA, HIGH DOSE SEASONAL PF 01/04/2014   Influenza-Unspecified 12/29/2012   Pneumococcal-Unspecified 07/21/2008   Td 04/27/2005     Objective: Vital Signs: There were no vitals taken for this visit.   Physical Exam   Musculoskeletal Exam: ***  CDAI Exam: CDAI Score: -- Patient Global: --; Provider Global: -- Swollen: --; Tender: -- Joint Exam 04/30/2024   No joint exam has been documented for this visit   There is currently no information documented on the homunculus. Go to the Rheumatology activity and complete the homunculus joint exam.  Investigation: No additional findings.  Imaging: No results found.  Recent Labs: Lab Results  Component Value Date   WBC 9.5 12/14/2023   HGB 10.4 (L) 12/14/2023   PLT 229 12/14/2023   NA 140 12/14/2023   K 4.3 12/14/2023  CL 106 12/14/2023   CO2 23 12/14/2023   GLUCOSE 104 (H) 12/14/2023   BUN 14 12/14/2023   CREATININE 0.86 12/14/2023   BILITOT 0.6 10/19/2014   ALKPHOS 45 10/19/2014   AST 24 10/19/2014   ALT 27 10/19/2014   PROT 6.9 10/19/2014   ALBUMIN 4.1 10/19/2014   CALCIUM  9.3 12/14/2023   GFRAA 74 10/19/2014    Speciality Comments: No specialty comments available.  Procedures:  No procedures performed Allergies: Penicillins and Wellbutrin [bupropion]   Assessment / Plan:     Visit Diagnoses:  No diagnosis found.  Orders: No orders of the defined types were placed in this encounter.  No orders of the defined types were placed in this encounter.   Face-to-face time spent with patient was *** minutes. Greater than 50% of time was spent in counseling and coordination of care.  Follow-Up Instructions: No follow-ups on file.   Alfonso Patterson, LPN  Note - This record has been created using [employer].  Chart creation errors have been sought, but may not always  have been located. Such creation errors do not reflect on  the standard of medical care.    [1]  Social History Tobacco Use   Smoking status: Former    Current packs/day: 0.00    Types: Cigarettes    Quit date: 07/01/1998    Years since quitting: 25.8   Smokeless tobacco: Never  Vaping Use   Vaping status: Never Used  Substance Use Topics   Alcohol use: No   Drug use: No   "

## 2024-04-30 ENCOUNTER — Ambulatory Visit

## 2024-05-15 NOTE — Progress Notes (Incomplete)
 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 RH female with a history of hypertension, hyperlipidemia, depression, anxiety seen today in follow up for memory loss.Daughter reports that she has been diagnosed with vascular dementia by her PCP while living in  [place], [place]. She was not seen  by neurologist while there.  Patient is currently on***.   Patient was last seen on ***. Memory is ***. MMSE today is  /30. Patient is able to participate on ADLs and continues to drive without difficulties. Mood is*** . This patient is accompanied in the office by *** who supplements the history.  Previous records as well as any outside records available were reviewed prior to todays visit.   Follow up in  months Start donepezil  10 mg daily as directed, side effects discussed  Recommend good control of cardiovascular risk factors.   Continue to control mood as per PCP   Discussed the use of AI scribe software for clinical note transcription with the patient, who gave verbal consent to proceed.  History of Present Illness   Initial visit 02/04/2024 How long did patient have memory difficulties?  For about 1.5 years, when she was diagnosed in [place] with vascular dementia. Patient reports difficulty  with STM such as  new information, recent conversations, names. LTM is very good she can tell you stories from a long time ago.  She also has difficulty making decisions and has issues on how to use the remote control.  repeats oneself?  Endorsed Disoriented when walking into a room? Denies    Leaving objects in unusual places?  Endorsed, in several places   Wandering behavior? Denies.   Any personality changes, or depression, anxiety? She has anxiety and some depression Hallucinations or paranoia? Denies.   Seizures? Denies.    Any sleep changes?  Sleeps well. Denies frequent nightmares or dream reenactment, other REM  behavior or sleepwalking   Sleep apnea? Maybe, but has not been tested for the last 7 years daughter says  Any hygiene concerns?  Denies.   Independent of bathing and dressing? Endorsed  Does the patient need help with medications?Daughter is in charge   Who is in charge of the finances? Daughter  is in charge     Any changes in appetite?   Denies.     Patient have trouble swallowing?  Denies.   Does the patient cook? No   Any headaches?  Denies.   Chronic pain? She has chronic pain from arthritis   Ambulates with difficulty? she had a recent closed R hip fracture on 12/10/2023 after a mechanical fall at home, no LOC, no head injury. Doing PT Recent falls or head injuries? She broke her hip and had a rod readjustment 5 weeks ago Vision changes?  Denies any new issues  Any strokelike symptoms? Had several TIA for the last 2 years-daughter says (no neuro records seen)  Any tremors? Denies.   Any anosmia? Denies.   Any incontinence of urine? Yes  Any bowel dysfunction?incontinence of stool Patient lives with daughter   History of heavy alcohol intake? Denies.   History of heavy tobacco use? Denies.   Family history of dementia?   Mother with dementia , AD. Brother with vascular dementia Does patient drive? 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  No hydrocephalus       No data to display            02/04/2024    5:00 PM  Montreal Cognitive Assessment   Visuospatial/ Executive (0/5) 1  Naming (0/3) 1  Attention: Read list of digits (0/2) 2  Attention: Read list of letters (0/1) 1  Attention: Serial 7  subtraction starting at 100 (0/3) 1  Language: Repeat phrase (0/2) 0  Language : Fluency (0/1) 0  Abstraction (0/2) 1  Delayed Recall (0/5) 0  Orientation (0/6) 6  Total 13  Adjusted Score (based on education) 13      Objective:    Neurological Exam:    VITALS:  There were no vitals filed for this visit.  GEN:  The patient appears stated age and is in NAD. HEENT:  Normocephalic, atraumatic.   Neurological examination:  General: NAD, well-groomed, appears stated age. Orientation: The patient is alert. Oriented to person, place and not to date Cranial nerves: There is good facial symmetry.The speech is fluent and clear. No aphasia or dysarthria. Fund of knowledge is appropriate. Recent and remote memory are impaired. Attention and concentration are reduced. Able to name objects and unable to repeat phrases.  Hearing is decreased to conversational tone. *** Sensation: Sensation is intact to light touch throughout Motor: Strength is at least antigravity x4. DTR's 2/4 in UE/LE     Movement examination:  Tone: There is normal tone in the UE/LE Abnormal movements:  no tremor.  No myoclonus.  No asterixis.   Coordination:  There is no decremation with RAM's. Normal finger to nose  Gait and Station: The patient has no*** difficulty arising out of a deep-seated chair without the use of the hands. The patient's stride length is short, uses a walker for stability.  Gait is cautious and narrow.    Thank you for allowing us  the opportunity to participate in the care of this nice patient. Please do not hesitate to contact us  for any questions or concerns.   Total time spent on today's visit was *** minutes dedicated to this patient today, preparing to see patient, examining the patient, ordering tests and/or medications and counseling the patient, documenting clinical information in the EHR or other health record, independently interpreting results and communicating results to the  patient/family, discussing treatment and goals, answering patient's questions and coordinating care.  Cc:  System, Provider Not In  Camie Sevin 05/15/2024 6:07 AM      "

## 2024-05-18 ENCOUNTER — Ambulatory Visit: Admitting: Physician Assistant
